# Patient Record
Sex: Female | Born: 1985 | Race: Black or African American | Hispanic: No | Marital: Single | Smoking: Never smoker
Health system: Southern US, Community
[De-identification: ages and names within clinical notes are randomized; demographics above are authoritative.]

## PROBLEM LIST (undated history)

## (undated) ENCOUNTER — Emergency Department (HOSPITAL_BASED_OUTPATIENT_CLINIC_OR_DEPARTMENT_OTHER): Admission: EM | Payer: BC Managed Care – PPO | Source: Home / Self Care

## (undated) DIAGNOSIS — E669 Obesity, unspecified: Secondary | ICD-10-CM

## (undated) DIAGNOSIS — I10 Essential (primary) hypertension: Secondary | ICD-10-CM

## (undated) DIAGNOSIS — E119 Type 2 diabetes mellitus without complications: Secondary | ICD-10-CM

## (undated) HISTORY — PX: CHOLECYSTECTOMY: SHX55

## (undated) HISTORY — PX: KNEE SURGERY: SHX244

## (undated) HISTORY — PX: TONSILLECTOMY: SUR1361

---

## 2013-09-30 ENCOUNTER — Emergency Department (HOSPITAL_COMMUNITY): Payer: Self-pay

## 2013-09-30 ENCOUNTER — Encounter (HOSPITAL_COMMUNITY): Payer: Self-pay | Admitting: Emergency Medicine

## 2013-09-30 ENCOUNTER — Emergency Department (HOSPITAL_COMMUNITY)
Admission: EM | Admit: 2013-09-30 | Discharge: 2013-10-01 | Disposition: A | Discharge status: Home / Self Care | Payer: Self-pay | Attending: Emergency Medicine | Admitting: Emergency Medicine

## 2013-09-30 DIAGNOSIS — N83202 Unspecified ovarian cyst, left side: Secondary | ICD-10-CM

## 2013-09-30 DIAGNOSIS — R000 Tachycardia, unspecified: Secondary | ICD-10-CM | POA: Insufficient documentation

## 2013-09-30 DIAGNOSIS — M47817 Spondylosis without myelopathy or radiculopathy, lumbosacral region: Secondary | ICD-10-CM | POA: Insufficient documentation

## 2013-09-30 DIAGNOSIS — R110 Nausea: Secondary | ICD-10-CM | POA: Insufficient documentation

## 2013-09-30 DIAGNOSIS — E669 Obesity, unspecified: Secondary | ICD-10-CM | POA: Insufficient documentation

## 2013-09-30 DIAGNOSIS — F17200 Nicotine dependence, unspecified, uncomplicated: Secondary | ICD-10-CM | POA: Insufficient documentation

## 2013-09-30 DIAGNOSIS — R51 Headache: Secondary | ICD-10-CM | POA: Insufficient documentation

## 2013-09-30 DIAGNOSIS — I10 Essential (primary) hypertension: Secondary | ICD-10-CM | POA: Insufficient documentation

## 2013-09-30 DIAGNOSIS — N83209 Unspecified ovarian cyst, unspecified side: Secondary | ICD-10-CM | POA: Insufficient documentation

## 2013-09-30 DIAGNOSIS — M549 Dorsalgia, unspecified: Secondary | ICD-10-CM | POA: Insufficient documentation

## 2013-09-30 DIAGNOSIS — Z3202 Encounter for pregnancy test, result negative: Secondary | ICD-10-CM | POA: Insufficient documentation

## 2013-09-30 HISTORY — DX: Obesity, unspecified: E66.9

## 2013-09-30 HISTORY — DX: Essential (primary) hypertension: I10

## 2013-09-30 LAB — I-STAT CHEM 8, ED
BUN: 8 mg/dL (ref 6–23)
CALCIUM ION: 1.22 mmol/L (ref 1.12–1.23)
Chloride: 106 mEq/L (ref 96–112)
Creatinine, Ser: 1.1 mg/dL (ref 0.50–1.10)
Glucose, Bld: 104 mg/dL — ABNORMAL HIGH (ref 70–99)
HEMATOCRIT: 47 % — AB (ref 36.0–46.0)
Hemoglobin: 16 g/dL — ABNORMAL HIGH (ref 12.0–15.0)
Potassium: 4 mEq/L (ref 3.7–5.3)
Sodium: 144 mEq/L (ref 137–147)
TCO2: 26 mmol/L (ref 0–100)

## 2013-09-30 LAB — CBC WITH DIFFERENTIAL/PLATELET
BASOS PCT: 1 % (ref 0–1)
Basophils Absolute: 0.1 10*3/uL (ref 0.0–0.1)
EOS PCT: 2 % (ref 0–5)
Eosinophils Absolute: 0.2 10*3/uL (ref 0.0–0.7)
HCT: 43.7 % (ref 36.0–46.0)
HEMOGLOBIN: 14.8 g/dL (ref 12.0–15.0)
Lymphocytes Relative: 33 % (ref 12–46)
Lymphs Abs: 4 10*3/uL (ref 0.7–4.0)
MCH: 29.2 pg (ref 26.0–34.0)
MCHC: 33.9 g/dL (ref 30.0–36.0)
MCV: 86.4 fL (ref 78.0–100.0)
MONO ABS: 0.8 10*3/uL (ref 0.1–1.0)
MONOS PCT: 7 % (ref 3–12)
Neutro Abs: 7.1 10*3/uL (ref 1.7–7.7)
Neutrophils Relative %: 57 % (ref 43–77)
Platelets: 283 10*3/uL (ref 150–400)
RBC: 5.06 MIL/uL (ref 3.87–5.11)
RDW: 13.6 % (ref 11.5–15.5)
WBC: 12.3 10*3/uL — ABNORMAL HIGH (ref 4.0–10.5)

## 2013-09-30 LAB — URINALYSIS, ROUTINE W REFLEX MICROSCOPIC
Bilirubin Urine: NEGATIVE
GLUCOSE, UA: NEGATIVE mg/dL
Ketones, ur: 15 mg/dL — AB
Leukocytes, UA: NEGATIVE
NITRITE: NEGATIVE
Protein, ur: >300 mg/dL — AB
Specific Gravity, Urine: 1.024 (ref 1.005–1.030)
Urobilinogen, UA: 0.2 mg/dL (ref 0.0–1.0)
pH: 5.5 (ref 5.0–8.0)

## 2013-09-30 LAB — URINE MICROSCOPIC-ADD ON

## 2013-09-30 LAB — POC URINE PREG, ED: Preg Test, Ur: NEGATIVE

## 2013-09-30 MED ORDER — ONDANSETRON 4 MG PO TBDP
8.0000 mg | ORAL_TABLET | Freq: Once | ORAL | Status: AC
  Administered 2013-09-30: 8 mg via ORAL
  Filled 2013-09-30: qty 2

## 2013-09-30 MED ORDER — SODIUM CHLORIDE 0.9 % IV BOLUS (SEPSIS)
1000.0000 mL | Freq: Once | INTRAVENOUS | Status: AC
Start: 2013-09-30 — End: 2013-09-30
  Administered 2013-09-30: 1000 mL via INTRAVENOUS

## 2013-09-30 MED ORDER — MORPHINE SULFATE 4 MG/ML IJ SOLN
4.0000 mg | Freq: Once | INTRAMUSCULAR | Status: AC
Start: 2013-09-30 — End: 2013-09-30
  Administered 2013-09-30: 4 mg via INTRAVENOUS
  Filled 2013-09-30: qty 1

## 2013-09-30 MED ORDER — TRAMADOL HCL 50 MG PO TABS: 50.0000 mg | ORAL_TABLET | Freq: Four times a day (QID) | ORAL | Status: DC | PRN

## 2013-09-30 MED ORDER — NAPROXEN 375 MG PO TABS: 375.0000 mg | ORAL_TABLET | Freq: Two times a day (BID) | ORAL | Status: DC

## 2013-09-30 NOTE — ED Notes (Addendum)
Pt reports onset today of headache, nausea, body pain and mid back pain. No acute distress noted at triage. Pt has been off bp meds x 2 months and also received first time depo injection today.

## 2013-09-30 NOTE — ED Provider Notes (Signed)
CSN: [id number]     Arrival date & time 09/30/13  1756 History   First MD Initiated Contact with Patient 09/30/13 2036     Chief Complaint  Patient presents with  . Back Pain  . Nausea     (Consider location/radiation/quality/duration/timing/severity/associated sxs/prior Treatment) HPI Comments: This normally healthy, 28 year old morbidly obese, female, who presents today with acute onset of headache, nausea, body pain, and back pain.  She states she went to work, but was unable to complete her shift.  She did not take anything for her symptoms.  She did receive a shot of Depakote today  The history is provided by the patient.    Past Medical History  Diagnosis Date  . Obesity   . Hypertension    History reviewed. No pertinent past surgical history. History reviewed. No pertinent family history. History  Substance Use Topics  . Smoking status: Not on file  . Smokeless tobacco: Not on file  . Alcohol Use: No   OB History   Grav Para Term Preterm Abortions TAB SAB Ect Mult Living                 Review of Systems  Respiratory: Negative for shortness of breath.   Cardiovascular: Negative for chest pain and leg swelling.  Gastrointestinal: Positive for nausea. Negative for vomiting.  Musculoskeletal: Positive for back pain and myalgias.  Skin: Negative for rash and wound.  Neurological: Positive for headaches.  All other systems reviewed and are negative.     Allergies  Review of patient's allergies indicates no known allergies.  Home Medications   Prior to Admission medications   Medication Sig Start Date End Date Taking? Authorizing Provider  naproxen (NAPROSYN) 375 MG tablet Take 1 tablet (375 mg total) by mouth 2 (two) times daily with a meal. 09/30/13   Arman Filter, NP  traMADol (ULTRAM) 50 MG tablet Take 1 tablet (50 mg total) by mouth every 6 (six) hours as needed. 09/30/13   Arman Filter, NP   BP 140/84  Pulse 87  Temp(Src) 98 F (36.7 C) (Oral)  Resp  20  SpO2 99%  LMP 09/10/2013 Physical Exam  Constitutional: She is oriented to person, place, and time. She appears well-developed and well-nourished.  Morbid obesity   HENT:  Head: Normocephalic and atraumatic.  Eyes: Pupils are equal, round, and reactive to light.  Neck: Normal range of motion.  Cardiovascular: Regular rhythm.  Tachycardia present.   Pulmonary/Chest: Effort normal and breath sounds normal. No respiratory distress.  Abdominal: Soft. She exhibits no distension.  exame limited by body habitus   Musculoskeletal: Normal range of motion.  Neurological: She is alert and oriented to person, place, and time.  Skin: Skin is warm. No rash noted.    ED Course  Procedures (including critical care time) Labs Review Labs Reviewed  URINALYSIS, ROUTINE W REFLEX MICROSCOPIC - Abnormal; Notable for the following:    APPearance CLOUDY (*)    Hgb urine dipstick LARGE (*)    Ketones, ur 15 (*)    Protein, ur >300 (*)    All other components within normal limits  URINE MICROSCOPIC-ADD ON - Abnormal; Notable for the following:    Squamous Epithelial / LPF MANY (*)    Bacteria, UA FEW (*)    Casts HYALINE CASTS (*)    All other components within normal limits  CBC WITH DIFFERENTIAL - Abnormal; Notable for the following:    WBC 12.3 (*)    All other components  within normal limits  I-STAT CHEM 8, ED - Abnormal; Notable for the following:    Glucose, Bld 104 (*)    Hemoglobin 16.0 (*)    HCT 47.0 (*)    All other components within normal limits  POC URINE PREG, ED    Imaging Review Ct Abdomen Pelvis Wo Contrast  09/30/2013   CLINICAL DATA:  Hematuria; pain and nausea  EXAM: CT ABDOMEN AND PELVIS WITHOUT CONTRAST  TECHNIQUE: Multidetector CT imaging of the abdomen and pelvis was performed following the standard protocol without oral or intravenous contrast material administration.  COMPARISON:  None.  FINDINGS: Lung bases are clear.  Liver is prominent measuring 18.3 cm in  length. No focal liver lesions are identified on this noncontrast enhanced study. Gallbladder is absent. There is no appreciable biliary duct dilatation.  Spleen, pancreas, and adrenals appear normal.  Kidneys bilaterally show no appreciable mass, hydronephrosis, or calculus on either side. No ureteral calculus or ureterectasis is appreciable on either side.  In the pelvis, urinary bladder is partially decompressed. There is no appreciable urinary bladder wall thickening. There is a cystic left ovarian mass measuring 5.9 x 3.9 cm. There is no other pelvic mass. There is no pelvic fluid. Appendix appears normal.  There is no appreciable bowel obstruction. No free air or portal venous air. There is no ascites, adenopathy, or abscess in the abdomen or pelvis. There is no abdominal aortic aneurysm. There are no blastic or lytic bone lesions. 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 Vacuum phenomenon is also noted in each sacroiliac joint.  IMPRESSION: Cystic left ovarian mass. This finding may warrant correlation with pelvic ultrasound to further assess.  No renal or ureteral calculus. No hydronephrosis. A cause for hematuria has not been established with this study.  Liver is enlarged. No focal liver lesions appreciable. Gallbladder absent.  No bowel obstruction. No abscess. Appendix appears normal. Note that there is osteoarthritic change in both hip and sacroiliac joint regions.   Electronically Signed   By: Bretta Bang M.D.   On: 09/30/2013 21:43   US Transvaginal Non-ob  09/30/2013   CLINICAL DATA:  Evaluate mass seen on recent CT examination in the left adnexa.  EXAM: TRANSABDOMINAL AND TRANSVAGINAL ULTRASOUND OF PELVIS  DOPPLER ULTRASOUND OF OVARIES  TECHNIQUE: Both transabdominal and transvaginal ultrasound examinations of the pelvis were performed. Transabdominal technique was performed for global imaging of the pelvis including  uterus, ovaries, adnexal regions, and pelvic cul-de-sac.  It was necessary to proceed with endovaginal exam following the transabdominal exam to visualize the ovaries. Color and duplex Doppler ultrasound was utilized to evaluate blood flow to the ovaries.  COMPARISON:  CT of the abdomen and pelvis 09/30/2013.  FINDINGS: Uterus  Measurements: 7.5 x 3.2 x 3.2 cm. No fibroids or other mass visualized.  Endometrium  Thickness: 4.3 mm thick.  No focal abnormality visualized.  Right ovary  Could not be visualized.  Left ovary  Measurements: 5.3 x 4.9 x 5.6 cm. Nearly the entire left ovary appears replaced by a complex partially cystic and solid minus with internal areas better anechoic and other areas that are isoechoic to hyperechoic. This lesion does not demonstrate definitive internal blood flow, this favored to represent a dermoid cyst.  Pulsed Doppler evaluation of the left ovary demonstrates normal low-resistance arterial and venous waveforms.  Other findings  No free fluid.  IMPRESSION: 1. Left ovarian mass has imaging characteristics that are most suggestive  of a dermoid cyst. Nonemergent surgical consultation may be warranted to consider removal, as this lesion although likely benign may place the patient at risk for torsion. No signs of left ovarian torsion at this time. Alternatively, a follow-up pelvic ultrasound could be obtained in 6-12 weeks to ensure short-term stability, with followed by yearly follow-up to demonstrate continued stability. 2. Limited study which was not able to visualize a right ovary.   Electronically Signed   By: Trudie Reed M.D.   On: 09/30/2013 23:22   US Pelvis Complete  09/30/2013   CLINICAL DATA:  Evaluate mass seen on recent CT examination in the left adnexa.  EXAM: TRANSABDOMINAL AND TRANSVAGINAL ULTRASOUND OF PELVIS  DOPPLER ULTRASOUND OF OVARIES  TECHNIQUE: Both transabdominal and transvaginal ultrasound examinations of the pelvis were performed. Transabdominal  technique was performed for global imaging of the pelvis including uterus, ovaries, adnexal regions, and pelvic cul-de-sac.  It was necessary to proceed with endovaginal exam following the transabdominal exam to visualize the ovaries. Color and duplex Doppler ultrasound was utilized to evaluate blood flow to the ovaries.  COMPARISON:  CT of the abdomen and pelvis 09/30/2013.  FINDINGS: Uterus  Measurements: 7.5 x 3.2 x 3.2 cm. No fibroids or other mass visualized.  Endometrium  Thickness: 4.3 mm thick.  No focal abnormality visualized.  Right ovary  Could not be visualized.  Left ovary  Measurements: 5.3 x 4.9 x 5.6 cm. Nearly the entire left ovary appears replaced by a complex partially cystic and solid minus with internal areas better anechoic and other areas that are isoechoic to hyperechoic. This lesion does not demonstrate definitive internal blood flow, this favored to represent a dermoid cyst.  Pulsed Doppler evaluation of the left ovary demonstrates normal low-resistance arterial and venous waveforms.  Other findings  No free fluid.  IMPRESSION: 1. Left ovarian mass has imaging characteristics that are most suggestive of a dermoid cyst. Nonemergent surgical consultation may be warranted to consider removal, as this lesion although likely benign may place the patient at risk for torsion. No signs of left ovarian torsion at this time. Alternatively, a follow-up pelvic ultrasound could be obtained in 6-12 weeks to ensure short-term stability, with followed by yearly follow-up to demonstrate continued stability. 2. Limited study which was not able to visualize a right ovary.   Electronically Signed   By: Trudie Reed M.D.   On: 09/30/2013 23:22   [place] Art/ven Flow Abd Pelv Doppler  09/30/2013   CLINICAL DATA:  Evaluate mass seen on recent CT examination in the left adnexa.  EXAM: TRANSABDOMINAL AND TRANSVAGINAL ULTRASOUND OF PELVIS  DOPPLER ULTRASOUND OF OVARIES  TECHNIQUE: Both transabdominal and  transvaginal ultrasound examinations of the pelvis were performed. Transabdominal technique was performed for global imaging of the pelvis including uterus, ovaries, adnexal regions, and pelvic cul-de-sac.  It was necessary to proceed with endovaginal exam following the transabdominal exam to visualize the ovaries. Color and duplex Doppler ultrasound was utilized to evaluate blood flow to the ovaries.  COMPARISON:  CT of the abdomen and pelvis 09/30/2013.  FINDINGS: Uterus  Measurements: 7.5 x 3.2 x 3.2 cm. No fibroids or other mass visualized.  Endometrium  Thickness: 4.3 mm thick.  No focal abnormality visualized.  Right ovary  Could not be visualized.  Left ovary  Measurements: 5.3 x 4.9 x 5.6 cm. Nearly the entire left ovary appears replaced by a complex partially cystic and solid minus with internal areas better anechoic and other areas that are isoechoic to hyperechoic.  This lesion does not demonstrate definitive internal blood flow, this favored to represent a dermoid cyst.  Pulsed Doppler evaluation of the left ovary demonstrates normal low-resistance arterial and venous waveforms.  Other findings  No free fluid.  IMPRESSION: 1. Left ovarian mass has imaging characteristics that are most suggestive of a dermoid cyst. Nonemergent surgical consultation may be warranted to consider removal, as this lesion although likely benign may place the patient at risk for torsion. No signs of left ovarian torsion at this time. Alternatively, a follow-up pelvic ultrasound could be obtained in 6-12 weeks to ensure short-term stability, with followed by yearly follow-up to demonstrate continued stability. 2. Limited study which was not able to visualize a right ovary.   Electronically Signed   By: Trudie Reedaniel  Entrikin M.D.   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 This has been discussed with the patient The ultrasound shows the patient has a dermoid cyst.  [facility].  She's been cautioned that he has a propensity to become torsed issues.  Return if she develops sharp pain in her left lower abdomen, otherwise, [facility]    Arman FilterGail K Aneliese Beaudry, NP 09/30/13 2341

## 2013-09-30 NOTE — ED Notes (Signed)
Back from us

## 2013-09-30 NOTE — ED Notes (Signed)
Patient transported to Ultrasound 

## 2013-09-30 NOTE — ED Notes (Signed)
Patient transported to CT 

## 2013-09-30 NOTE — Discharge Instructions (Signed)
Your CT scan showed that you have moderate osteoarthritis in your lower back, and spine.  This is most likely causing most of your discomfort, you also were found incidentally to have a small cyst on your left ovary.  This was further investigated with an ultrasound, which shows it is a dermoid cyst, and should be monitored by OB/GYN.  If he developed sudden, sharp pain in your left lower abdomen, please return to the emergency department for further evaluation, as this is has a chance to become twisted causing you to have increased sharp pain

## 2013-09-30 NOTE — ED Notes (Signed)
Report to Jenna RN

## 2013-10-01 NOTE — ED Provider Notes (Signed)
Medical screening examination/treatment/procedure(s) were performed by non-physician practitioner and as supervising physician I was immediately available for consultation/collaboration.   EKG Interpretation None        Candyce ChurnJohn David Clemmie Marxen III, MD 10/01/13 [id number]

## 2013-10-30 ENCOUNTER — Emergency Department (HOSPITAL_BASED_OUTPATIENT_CLINIC_OR_DEPARTMENT_OTHER)
Admission: EM | Admit: 2013-10-30 | Discharge: 2013-10-30 | Disposition: A | Discharge status: Home / Self Care | Payer: BC Managed Care – PPO | Attending: Emergency Medicine | Admitting: Emergency Medicine

## 2013-10-30 ENCOUNTER — Encounter (HOSPITAL_BASED_OUTPATIENT_CLINIC_OR_DEPARTMENT_OTHER): Payer: Self-pay | Admitting: Emergency Medicine

## 2013-10-30 DIAGNOSIS — E669 Obesity, unspecified: Secondary | ICD-10-CM | POA: Diagnosis not present

## 2013-10-30 DIAGNOSIS — M549 Dorsalgia, unspecified: Secondary | ICD-10-CM | POA: Insufficient documentation

## 2013-10-30 DIAGNOSIS — M25569 Pain in unspecified knee: Secondary | ICD-10-CM | POA: Insufficient documentation

## 2013-10-30 DIAGNOSIS — Z79899 Other long term (current) drug therapy: Secondary | ICD-10-CM | POA: Insufficient documentation

## 2013-10-30 DIAGNOSIS — M546 Pain in thoracic spine: Secondary | ICD-10-CM | POA: Insufficient documentation

## 2013-10-30 DIAGNOSIS — E119 Type 2 diabetes mellitus without complications: Secondary | ICD-10-CM | POA: Insufficient documentation

## 2013-10-30 DIAGNOSIS — I10 Essential (primary) hypertension: Secondary | ICD-10-CM | POA: Diagnosis not present

## 2013-10-30 HISTORY — DX: Type 2 diabetes mellitus without complications: E11.9

## 2013-10-30 MED ORDER — IBUPROFEN 800 MG PO TABS: 800.0000 mg | ORAL_TABLET | Freq: Three times a day (TID) | ORAL | Status: DC

## 2013-10-30 MED ORDER — CYCLOBENZAPRINE HCL 10 MG PO TABS
10.0000 mg | ORAL_TABLET | Freq: Two times a day (BID) | ORAL | Status: DC | PRN
Start: 2013-10-30 — End: 2014-04-15

## 2013-10-30 MED ORDER — IBUPROFEN 800 MG PO TABS
800.0000 mg | ORAL_TABLET | Freq: Once | ORAL | Status: AC
  Administered 2013-10-30: 800 mg via ORAL
  Filled 2013-10-30: qty 1

## 2013-10-30 NOTE — Discharge Instructions (Signed)

## 2013-10-30 NOTE — ED Provider Notes (Signed)
CSN: [id number]     Arrival date & time 10/30/13  1828 History   First MD Initiated Contact with Patient 10/30/13 2001     This chart was scribed for Elwin Mocha, MD by Arlan Organ, ED Scribe. This patient was seen in room MH11/MH11 and the patient's care was started 8:08 PM.   Chief Complaint  Patient presents with  . Back Pain  . Knee Pain   Patient is a 28 y.o. female presenting with back pain. The history is provided by the patient. No language interpreter was used.  Back Pain Location:  Generalized Radiates to:  Does not radiate Pain severity:  Moderate Pain is:  Worse during the day Onset quality:  Gradual Duration:  1 week Timing:  Constant Progression:  Worsening Chronicity:  New Context: not falling, not lifting heavy objects and not recent injury   Relieved by:  Nothing Worsened by:  Sitting Ineffective treatments:  NSAIDs Associated symptoms: no abdominal pain, no chest pain and no fever     HPI Comments: Jessica Terrell is a 28 y.o. female who presents to the Emergency Department complaining of constant, moderate back pain x 1.5 weeks. Pt states pain has gradually worsened since time of onset. Ms. Keiper also reports bilaterally knee pain; R worse than L. No known trauma or injury. Pain is exacerbated with sitting for long periods of time and alleviating when standing. She has tried OTC Naproxen with mild temporary improvement for symptoms. She denies any fever, chills, nausea, vomiting, abdominal pain, CP, or SOB. Pt currently works at a call center as a Museum/gallery conservator. She denies getting up for short breaks throughout her shifts. No known allergies to medications.  Past Medical History  Diagnosis Date  . Obesity   . Hypertension   . Diabetes mellitus without complication    Past Surgical History  Procedure Laterality Date  . Cholecystectomy    . Tonsillectomy    . Knee surgery     No family history on file. History  Substance Use Topics  . Smoking  status: Never Smoker   . Smokeless tobacco: Not on file  . Alcohol Use: No   OB History   Grav Para Term Preterm Abortions TAB SAB Ect Mult Living                 Review of Systems  Constitutional: Negative for fever and chills.  Respiratory: Negative for shortness of breath.   Cardiovascular: Negative for chest pain.  Gastrointestinal: Negative for nausea, vomiting and abdominal pain.  Musculoskeletal: Positive for back pain.  All other systems reviewed and are negative.     Allergies  Review of patient's allergies indicates no known allergies.  Home Medications   Prior to Admission medications   Medication Sig Start Date End Date Taking? Authorizing Provider  naproxen (NAPROSYN) 375 MG tablet Take 1 tablet (375 mg total) by mouth 2 (two) times daily with a meal. 09/30/13   Arman Filter, NP  traMADol (ULTRAM) 50 MG tablet Take 1 tablet (50 mg total) by mouth every 6 (six) hours as needed. 09/30/13   Arman Filter, NP   Triage Vitals: BP 167/98  Pulse 106  Temp(Src) 98.5 F (36.9 C) (Oral)  Resp 20  Ht  (1.626 m)  Wt 350 lb (158.759 kg)  BMI 60.05 kg/m2  SpO2 100%  LMP 10/23/2013   Physical Exam  Nursing note and vitals reviewed. Constitutional: She is oriented to person, place, and time. She appears well-developed  and well-nourished. No distress.  HENT:  Head: Normocephalic and atraumatic.  Mouth/Throat: Oropharynx is clear and moist.  Eyes: EOM are normal. Pupils are equal, round, and reactive to light.  Neck: Normal range of motion. Neck supple.  Cardiovascular: Normal rate and regular rhythm.  Exam reveals no friction rub.   No murmur heard. Pulmonary/Chest: Effort normal and breath sounds normal. No respiratory distress. She has no wheezes. She has no rales.  Abdominal: Soft. She exhibits no distension. There is no tenderness. There is no rebound.  Musculoskeletal: Normal range of motion. She exhibits no edema.       Cervical back: She exhibits  tenderness. She exhibits normal range of motion, no bony tenderness, no swelling and no edema.       Thoracic back: She exhibits tenderness. She exhibits normal range of motion, no bony tenderness, no swelling, no edema and no deformity.  Diffuse upper back muscular tenderness  Neurological: She is alert and oriented to person, place, and time. No cranial nerve deficit. She exhibits normal muscle tone. Coordination normal.  Skin: Skin is warm. No rash noted. She is not diaphoretic.    ED Course  Procedures (including critical care time)  DIAGNOSTIC STUDIES: Oxygen Saturation is 100% on RA, Normal by my interpretation.    COORDINATION OF CARE: 8:31 PM-Discussed treatment plan with pt at bedside and pt agreed to plan.     Labs Review Labs Reviewed - No data to display  Imaging Review No results found.   EKG Interpretation None      MDM   Final diagnoses:  Midline thoracic back pain    28 show female here with back pain. Started and one half weeks ago. At the computer all day. No injuries. No fevers. Tolerating by mouth. No vomiting or diarrhea. Vitals stable here.Marland KitchenExhibits diffuse upper back tenderness in the musculature. He states it's all mostly upper back. I suspect this is due to her morbid obesity and poor posture or. Instructed to take breaks at work and stand up to state it is better when she sits up straight so I recommended lumbar support at work. Stable for discharge.  I personally performed the services described in this documentation, which was scribed in my presence. The recorded information has been reviewed and is accurate.    Elwin Mocha, MD 10/30/13 [phone]

## 2013-10-30 NOTE — ED Notes (Signed)
Back pain and pain in both knees but worse on the right. Pain started 1.5 weeks ago. No known injury. States it is hard for her to focus at work due to pain.

## 2013-11-05 ENCOUNTER — Encounter: Payer: Self-pay | Admitting: Obstetrics & Gynecology

## 2013-11-14 ENCOUNTER — Encounter (HOSPITAL_COMMUNITY): Payer: Self-pay | Admitting: Emergency Medicine

## 2013-11-14 ENCOUNTER — Emergency Department (INDEPENDENT_AMBULATORY_CARE_PROVIDER_SITE_OTHER)
Admission: EM | Admit: 2013-11-14 | Discharge: 2013-11-14 | Disposition: A | Discharge status: Home / Self Care | Payer: BC Managed Care – PPO | Source: Home / Self Care | Attending: Family Medicine | Admitting: Family Medicine

## 2013-11-14 DIAGNOSIS — G44209 Tension-type headache, unspecified, not intractable: Secondary | ICD-10-CM

## 2013-11-14 DIAGNOSIS — R110 Nausea: Secondary | ICD-10-CM

## 2013-11-14 DIAGNOSIS — B349 Viral infection, unspecified: Secondary | ICD-10-CM

## 2013-11-14 DIAGNOSIS — B9789 Other viral agents as the cause of diseases classified elsewhere: Secondary | ICD-10-CM

## 2013-11-14 LAB — POCT I-STAT, CHEM 8
BUN: 4 mg/dL — ABNORMAL LOW (ref 6–23)
CREATININE: 1 mg/dL (ref 0.50–1.10)
Calcium, Ion: 1.21 mmol/L (ref 1.12–1.23)
Chloride: 107 mEq/L (ref 96–112)
Glucose, Bld: 119 mg/dL — ABNORMAL HIGH (ref 70–99)
HCT: 51 % — ABNORMAL HIGH (ref 36.0–46.0)
HEMOGLOBIN: 17.3 g/dL — AB (ref 12.0–15.0)
POTASSIUM: 4 meq/L (ref 3.7–5.3)
SODIUM: 142 meq/L (ref 137–147)
TCO2: 25 mmol/L (ref 0–100)

## 2013-11-14 LAB — POCT URINALYSIS DIP (DEVICE)
Bilirubin Urine: NEGATIVE
Glucose, UA: NEGATIVE mg/dL
Hgb urine dipstick: NEGATIVE
Ketones, ur: NEGATIVE mg/dL
Leukocytes, UA: NEGATIVE
Nitrite: NEGATIVE
Protein, ur: >=300 mg/dL — AB
Specific Gravity, Urine: 1.025 (ref 1.005–1.030)
UROBILINOGEN UA: 0.2 mg/dL (ref 0.0–1.0)
pH: 6 (ref 5.0–8.0)

## 2013-11-14 LAB — POCT PREGNANCY, URINE: PREG TEST UR: NEGATIVE

## 2013-11-14 MED ORDER — ONDANSETRON 8 MG PO TBDP: 8.0000 mg | ORAL_TABLET | Freq: Three times a day (TID) | ORAL | Status: DC | PRN

## 2013-11-14 MED ORDER — FLUTICASONE PROPIONATE 50 MCG/ACT NA SUSP: 2.0000 | Freq: Two times a day (BID) | NASAL | Status: DC

## 2013-11-14 MED ORDER — CETIRIZINE HCL 10 MG PO TABS: 10.0000 mg | ORAL_TABLET | Freq: Every day | ORAL | Status: DC

## 2013-11-14 NOTE — ED Notes (Signed)
C/o HA, BA and feeling nauseas onset Wednesday Denies cold sx, f/v/d Alert, no signs of acute distress.

## 2013-11-14 NOTE — Discharge Instructions (Signed)

## 2013-11-14 NOTE — ED Provider Notes (Signed)
CSN: [id number]     Arrival date & time 11/14/13  1246 History   First MD Initiated Contact with Patient 11/14/13 1331     Chief Complaint  Patient presents with  . Generalized Body Aches   (Consider location/radiation/quality/duration/timing/severity/associated sxs/prior Treatment) HPI Comments: 28 year old morbidly obese female with history of hypertension and diabetes presents complaining of frontal headache, mild diffuse body aches, nausea. Her symptoms began 2 days ago. She has been taking over-the-counter medications with minimal relief. She denies fever, chills, vomiting, diarrhea, abdominal pain, cough, shortness of breath. 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   Past Medical History  Diagnosis Date  . Obesity   . Hypertension   . Diabetes mellitus without complication    Past Surgical History  Procedure Laterality Date  . Cholecystectomy    . Tonsillectomy    . Knee surgery     No family history on file. History  Substance Use Topics  . Smoking status: Never Smoker   . Smokeless tobacco: Not on file  . Alcohol Use: No   OB History   Grav Para Term Preterm Abortions TAB SAB Ect Mult Living                 Review of Systems  Constitutional: Negative for fever and chills.  HENT: Positive for sinus pressure. Negative for congestion, ear pain and rhinorrhea.   Respiratory: Negative for cough, chest tightness, shortness of breath and wheezing.   Cardiovascular: Negative for chest pain.  Gastrointestinal: Positive for nausea. Negative for vomiting, abdominal pain and diarrhea.  Neurological: Positive for headaches. Negative for dizziness and weakness.  All other systems reviewed and are negative.   Allergies  Review of patient's allergies indicates no known allergies.  Home Medications   Prior to Admission medications   Medication Sig Start Date End Date Taking? Authorizing Provider   cyclobenzaprine (FLEXERIL) 10 MG tablet Take 1 tablet (10 mg total) by mouth 2 (two) times daily as needed for muscle spasms. 10/30/13  Yes Elwin Mocha, MD  ibuprofen (ADVIL,MOTRIN) 800 MG tablet Take 1 tablet (800 mg total) by mouth 3 (three) times daily. 10/30/13  Yes Elwin Mocha, MD  cetirizine (ZYRTEC) 10 MG tablet Take 1 tablet (10 mg total) by mouth daily. 11/14/13   Adrian Blackwater Thomasena Vandenheuvel, PA-C  fluticasone (FLONASE) 50 MCG/ACT nasal spray Place 2 sprays into both nostrils 2 (two) times daily. Decrease to 2 sprays/nostril daily after 5 days 11/14/13   Graylon Good, PA-C  naproxen (NAPROSYN) 375 MG tablet Take 1 tablet (375 mg total) by mouth 2 (two) times daily with a meal. 09/30/13   Arman Filter, NP  ondansetron (ZOFRAN ODT) 8 MG disintegrating tablet Take 1 tablet (8 mg total) by mouth every 8 (eight) hours as needed for nausea or vomiting. 11/14/13   Graylon Good, PA-C  traMADol (ULTRAM) 50 MG tablet Take 1 tablet (50 mg total) by mouth every 6 (six) hours as needed. 09/30/13   Arman Filter, NP   BP 145/93  Pulse 88  Temp(Src) 97.9 F (36.6 C) (Oral)  Resp 16  SpO2 96%  LMP 10/23/2013 Physical Exam  Nursing note and vitals reviewed. Constitutional: She is oriented to person, place, and time. Vital signs are normal. She appears well-developed and well-nourished. No distress.  Morbidly obese habitus  HENT:  Head: Normocephalic and atraumatic.  Right Ear: Tympanic membrane, external ear and ear canal normal.  Left Ear: Tympanic membrane, external ear and ear canal normal.  Nose: Nose normal. Right sinus exhibits no maxillary sinus tenderness and no frontal sinus tenderness. Left sinus exhibits no maxillary sinus tenderness and no frontal sinus tenderness.  Mouth/Throat: Uvula is midline, oropharynx is clear and moist and mucous membranes are normal.  Eyes: Conjunctivae are normal. Right eye exhibits no discharge. Left eye exhibits no discharge.  Neck: Normal range of motion. Neck  supple.  Cardiovascular: Normal rate, regular rhythm and normal heart sounds.   Pulmonary/Chest: Effort normal and breath sounds normal. No respiratory distress.  Lymphadenopathy:    She has no cervical adenopathy.  Neurological: She is alert and oriented to person, place, and time. She has normal strength. Coordination normal.  Skin: Skin is warm and dry. No rash noted. She is not diaphoretic.  Psychiatric: She has a normal mood and affect. Judgment normal.    ED Course  Procedures (including critical care time) Labs Review Labs Reviewed  POCT URINALYSIS DIP (DEVICE) - Abnormal; Notable for the following:    Protein, ur >=300 (*)    All other components within normal limits  POCT I-STAT, CHEM 8 - Abnormal; Notable for the following:    BUN 4 (*)    Glucose, Bld 119 (*)    Hemoglobin 17.3 (*)    HCT 51.0 (*)    All other components within normal limits  URINE CULTURE  POCT PREGNANCY, URINE    Imaging Review No results found.   MDM   1. Viral infection   2. Tension headache   3. Nausea    Physical exam is normal. Most likely viral infection. Followup with primary care to recheck the urine in a week. ED if worsening.   Meds ordered this encounter  Medications  . fluticasone (FLONASE) 50 MCG/ACT nasal spray    Sig: Place 2 sprays into both nostrils 2 (two) times daily. Decrease to 2 sprays/nostril daily after 5 days    Dispense:  16 g    Refill:  2    Order Specific Question:  Supervising Provider    Answer:  Linna Hoff [phone]  . cetirizine (ZYRTEC) 10 MG tablet    Sig: Take 1 tablet (10 mg total) by mouth daily.    Dispense:  30 tablet    Refill:  0    Order Specific Question:  Supervising Provider    Answer:  Linna Hoff [phone]  . ondansetron (ZOFRAN ODT) 8 MG disintegrating tablet    Sig: Take 1 tablet (8 mg total) by mouth every 8 (eight) hours as needed for nausea or vomiting.    Dispense:  12 tablet    Refill:  0    Order Specific Question:   Supervising Provider    Answer:  Bradd Canary D [[id number]]       Graylon Good, PA-C 11/14/13 [phone]

## 2013-11-15 LAB — URINE CULTURE: Colony Count: 50000

## 2013-11-15 NOTE — ED Provider Notes (Signed)
Medical screening examination/treatment/procedure(s) were performed by resident physician or non-physician practitioner and as supervising physician I was immediately available for consultation/collaboration.   Keno Caraway DOUGLAS MD.   Cherylin Waguespack D Amilliana Hayworth, MD 11/15/13 0954 

## 2013-12-15 ENCOUNTER — Encounter (HOSPITAL_COMMUNITY): Payer: Self-pay | Admitting: Emergency Medicine

## 2013-12-15 ENCOUNTER — Emergency Department (INDEPENDENT_AMBULATORY_CARE_PROVIDER_SITE_OTHER)
Admission: EM | Admit: 2013-12-15 | Discharge: 2013-12-15 | Disposition: A | Discharge status: Home / Self Care | Payer: BC Managed Care – PPO | Source: Home / Self Care | Attending: Family Medicine | Admitting: Family Medicine

## 2013-12-15 DIAGNOSIS — J020 Streptococcal pharyngitis: Secondary | ICD-10-CM

## 2013-12-15 LAB — POCT RAPID STREP A: STREPTOCOCCUS, GROUP A SCREEN (DIRECT): NEGATIVE

## 2013-12-15 MED ORDER — CLINDAMYCIN HCL 300 MG PO CAPS: 300.0000 mg | ORAL_CAPSULE | Freq: Three times a day (TID) | ORAL | Status: DC

## 2013-12-15 NOTE — Discharge Instructions (Signed)
Drink lots of fluids, take all of medicine, use lozenges as needed.return if needed °

## 2013-12-15 NOTE — ED Provider Notes (Signed)
CSN: [id number]     Arrival date & time 12/15/13  1334 History   First MD Initiated Contact with Patient 12/15/13 1443     Chief Complaint  Patient presents with  . Sore Throat   (Consider location/radiation/quality/duration/timing/severity/associated sxs/prior Treatment) Patient is a 28 y.o. female presenting with pharyngitis. The history is provided by the patient.  Sore Throat This is a new problem. The current episode started 2 days ago. The problem has been gradually worsening. Pertinent negatives include no chest pain, no abdominal pain and no headaches. The symptoms are aggravated by swallowing.    Past Medical History  Diagnosis Date  . Obesity   . Hypertension   . Diabetes mellitus without complication    Past Surgical History  Procedure Laterality Date  . Cholecystectomy    . Tonsillectomy    . Knee surgery     History reviewed. No pertinent family history. History  Substance Use Topics  . Smoking status: Never Smoker   . Smokeless tobacco: Not on file  . Alcohol Use: No   OB History   Grav Para Term Preterm Abortions TAB SAB Ect Mult Living                 Review of Systems  Constitutional: Positive for fever and chills.  HENT: Positive for congestion, postnasal drip and rhinorrhea.   Respiratory: Negative for cough.   Cardiovascular: Negative.  Negative for chest pain.  Gastrointestinal: Negative.  Negative for abdominal pain.  Skin: Negative.   Neurological: Negative for headaches.    Allergies  Review of patient's allergies indicates no known allergies.  Home Medications   Prior to Admission medications   Medication Sig Start Date End Date Taking? Authorizing Provider  cetirizine (ZYRTEC) 10 MG tablet Take 1 tablet (10 mg total) by mouth daily. 11/14/13   Graylon GoodZachary H Baker, PA-C  clindamycin (CLEOCIN) 300 MG capsule Take 1 capsule (300 mg total) by mouth 3 (three) times daily. 12/15/13   Linna HoffJames D Miaa Latterell, MD  cyclobenzaprine (FLEXERIL) 10 MG tablet  Take 1 tablet (10 mg total) by mouth 2 (two) times daily as needed for muscle spasms. 10/30/13   Elwin MochaBlair Walden, MD  fluticasone [facility](FLONASE) 50 MCG/ACT nasal spray Place 2 sprays into both nostrils 2 (two) times daily. Decrease to 2 sprays/nostril daily after 5 days 11/14/13   Graylon GoodZachary H Baker, PA-C  ibuprofen (ADVIL,MOTRIN) 800 MG tablet Take 1 tablet (800 mg total) by mouth 3 (three) times daily. 10/30/13   Elwin MochaBlair Walden, MD  naproxen (NAPROSYN) 375 MG tablet Take 1 tablet (375 mg total) by mouth 2 (two) times daily with a meal. 09/30/13   Arman FilterGail K Schulz, NP  ondansetron (ZOFRAN ODT) 8 MG disintegrating tablet Take 1 tablet (8 mg total) by mouth every 8 (eight) hours as needed for nausea or vomiting. 11/14/13   Graylon GoodZachary H Baker, PA-C  traMADol (ULTRAM) 50 MG tablet Take 1 tablet (50 mg total) by mouth every 6 (six) hours as needed. 09/30/13   Arman FilterGail K Schulz, NP   BP 150/97  Pulse 90  Temp(Src) 98.9 F (37.2 C) (Oral)  Resp 18  SpO2 97% Physical Exam  Nursing note and vitals reviewed. Constitutional: She is oriented to person, place, and time. She appears well-developed and well-nourished.  HENT:  Right Ear: External ear normal.  Left Ear: External ear normal.  Mouth/Throat: Oropharynx is clear and moist.  Eyes: Conjunctivae are normal. Pupils are equal, round, and reactive to light.  Neck: Normal range of motion. Neck  supple.  Cardiovascular: Normal heart sounds.   Pulmonary/Chest: Effort normal and breath sounds normal.  Lymphadenopathy:    She has cervical adenopathy.  Neurological: She is alert and oriented to person, place, and time.  Skin: Skin is warm and dry.    ED Course  Procedures (including critical care time) Labs Review Labs Reviewed  POCT RAPID STREP A ([facility] ONLY)    Imaging Review No results found.   MDM   1. Streptococcal sore throat        Linna HoffJames D Malissa Slay, MD 12/15/13 [phone]

## 2013-12-15 NOTE — ED Notes (Signed)
Pt  Reports     Symptoms  Of   sorethroat      With  Pain  When  She  Swallows        She  Also  Reports  Chills      And  Fever  As  Well   Pt  Reports  The  Symptoms  X  2  Days             Symptoms    Not  releived by    Tylenol

## 2013-12-16 LAB — CULTURE, GROUP A STREP

## 2013-12-18 ENCOUNTER — Telehealth (HOSPITAL_COMMUNITY): Payer: Self-pay | Admitting: *Deleted

## 2013-12-18 NOTE — ED Notes (Signed)
Throat culture: Strep beta hemolytic not group A.  Pt. adequately treated with Cleocin.  I called pt. Call 1. [name], Jessica Terrell M 12/18/2013

## 2013-12-24 NOTE — ED Notes (Signed)
Unable to reach pt. by phone x 3.  Confidential marked letter sent with lab result and instructions. Jessica Terrell, [name] M 12/24/2013

## 2014-02-05 ENCOUNTER — Emergency Department (HOSPITAL_COMMUNITY): Payer: BC Managed Care – PPO

## 2014-02-05 ENCOUNTER — Encounter (HOSPITAL_COMMUNITY): Payer: Self-pay | Admitting: Emergency Medicine

## 2014-02-05 ENCOUNTER — Emergency Department (HOSPITAL_COMMUNITY)
Admission: EM | Admit: 2014-02-05 | Discharge: 2014-02-06 | Disposition: A | Discharge status: Home / Self Care | Payer: Self-pay | Attending: Emergency Medicine | Admitting: Emergency Medicine

## 2014-02-05 DIAGNOSIS — R10814 Left lower quadrant abdominal tenderness: Secondary | ICD-10-CM

## 2014-02-05 DIAGNOSIS — Z3202 Encounter for pregnancy test, result negative: Secondary | ICD-10-CM | POA: Insufficient documentation

## 2014-02-05 DIAGNOSIS — E119 Type 2 diabetes mellitus without complications: Secondary | ICD-10-CM | POA: Insufficient documentation

## 2014-02-05 DIAGNOSIS — R809 Proteinuria, unspecified: Secondary | ICD-10-CM | POA: Insufficient documentation

## 2014-02-05 DIAGNOSIS — B9689 Other specified bacterial agents as the cause of diseases classified elsewhere: Secondary | ICD-10-CM

## 2014-02-05 DIAGNOSIS — E669 Obesity, unspecified: Secondary | ICD-10-CM | POA: Insufficient documentation

## 2014-02-05 DIAGNOSIS — R110 Nausea: Secondary | ICD-10-CM

## 2014-02-05 DIAGNOSIS — I10 Essential (primary) hypertension: Secondary | ICD-10-CM | POA: Insufficient documentation

## 2014-02-05 DIAGNOSIS — N760 Acute vaginitis: Secondary | ICD-10-CM | POA: Insufficient documentation

## 2014-02-05 LAB — CBC WITH DIFFERENTIAL/PLATELET
Basophils Absolute: 0 10*3/uL (ref 0.0–0.1)
Basophils Relative: 0 % (ref 0–1)
EOS ABS: 0.3 10*3/uL (ref 0.0–0.7)
EOS PCT: 2 % (ref 0–5)
HEMATOCRIT: 42.6 % (ref 36.0–46.0)
HEMOGLOBIN: 14.7 g/dL (ref 12.0–15.0)
LYMPHS ABS: 3.9 10*3/uL (ref 0.7–4.0)
Lymphocytes Relative: 36 % (ref 12–46)
MCH: 29 pg (ref 26.0–34.0)
MCHC: 34.5 g/dL (ref 30.0–36.0)
MCV: 84 fL (ref 78.0–100.0)
MONO ABS: 0.6 10*3/uL (ref 0.1–1.0)
MONOS PCT: 6 % (ref 3–12)
Neutro Abs: 6.1 10*3/uL (ref 1.7–7.7)
Neutrophils Relative %: 56 % (ref 43–77)
PLATELETS: 310 10*3/uL (ref 150–400)
RBC: 5.07 MIL/uL (ref 3.87–5.11)
RDW: 13.5 % (ref 11.5–15.5)
WBC: 10.8 10*3/uL — ABNORMAL HIGH (ref 4.0–10.5)

## 2014-02-05 LAB — URINALYSIS, ROUTINE W REFLEX MICROSCOPIC
Bilirubin Urine: NEGATIVE
Glucose, UA: NEGATIVE mg/dL
Hgb urine dipstick: NEGATIVE
KETONES UR: 15 mg/dL — AB
LEUKOCYTES UA: NEGATIVE
Nitrite: NEGATIVE
Specific Gravity, Urine: 1.024 (ref 1.005–1.030)
UROBILINOGEN UA: 1 mg/dL (ref 0.0–1.0)
pH: 5.5 (ref 5.0–8.0)

## 2014-02-05 LAB — COMPREHENSIVE METABOLIC PANEL
ALT: 16 U/L (ref 0–35)
ANION GAP: 16 — AB (ref 5–15)
AST: 15 U/L (ref 0–37)
Albumin: 3.9 g/dL (ref 3.5–5.2)
Alkaline Phosphatase: 71 U/L (ref 39–117)
BUN: 9 mg/dL (ref 6–23)
CALCIUM: 9.5 mg/dL (ref 8.4–10.5)
CO2: 21 mEq/L (ref 19–32)
CREATININE: 0.86 mg/dL (ref 0.50–1.10)
Chloride: 103 mEq/L (ref 96–112)
GLUCOSE: 105 mg/dL — AB (ref 70–99)
Potassium: 3.9 mEq/L (ref 3.7–5.3)
Sodium: 140 mEq/L (ref 137–147)
TOTAL PROTEIN: 7.9 g/dL (ref 6.0–8.3)
Total Bilirubin: 0.5 mg/dL (ref 0.3–1.2)

## 2014-02-05 LAB — URINE MICROSCOPIC-ADD ON

## 2014-02-05 LAB — PREGNANCY, URINE: Preg Test, Ur: NEGATIVE

## 2014-02-05 LAB — WET PREP, GENITAL
Trich, Wet Prep: NONE SEEN
YEAST WET PREP: NONE SEEN

## 2014-02-05 MED ORDER — ONDANSETRON HCL 4 MG PO TABS: 4.0000 mg | ORAL_TABLET | Freq: Four times a day (QID) | ORAL | Status: DC

## 2014-02-05 MED ORDER — KETOROLAC TROMETHAMINE 60 MG/2ML IM SOLN
60.0000 mg | Freq: Once | INTRAMUSCULAR | Status: AC
  Administered 2014-02-05: 60 mg via INTRAMUSCULAR
  Filled 2014-02-05: qty 2

## 2014-02-05 MED ORDER — METRONIDAZOLE 500 MG PO TABS: 500.0000 mg | ORAL_TABLET | Freq: Two times a day (BID) | ORAL | Status: DC

## 2014-02-05 MED ORDER — ONDANSETRON HCL 4 MG PO TABS
4.0000 mg | ORAL_TABLET | Freq: Once | ORAL | Status: AC
  Administered 2014-02-05: 4 mg via ORAL
  Filled 2014-02-05: qty 1

## 2014-02-05 NOTE — ED Provider Notes (Signed)
CSN: [id number]     Arrival date & time 02/05/14  1846 History   First MD Initiated Contact with Patient 02/05/14 2019     Chief Complaint  Patient presents with  . Abdominal Pain     (Consider location/radiation/quality/duration/timing/severity/associated sxs/prior Treatment) HPI  Jessica Terrell is a 28 y.o. female with PMH of DM, hypertension, obesity presenting with left lower quadrant abdominal tenderness which began 2 days ago. Pain is described as a sharp sensation that is intermittent. Not worse with movement or eating. Patient has taken Tylenol without relief. Patient with nausea but no vomiting. Last bowel movement yesterday and normal nonbloody. Patient denies any pelvic complaints. Last test. In August the patient is on Depakote. She denies spotting. No urinary complaints. No fevers or chills. Patient states she has decreased appetite but still eating. Patient states she has had similar pain in the past and was evaluated here in August with pelvic ultrasound and CT. Patient was found to have a dermoid cyst on her left ovary. Ultrasound recommended repeat ultrasound in 6-12 weeks. CT without other abdominal or pelvic abnormalities.   Past Medical History  Diagnosis Date  . Obesity   . Hypertension   . Diabetes mellitus without complication    Past Surgical History  Procedure Laterality Date  . Cholecystectomy    . Tonsillectomy    . Knee surgery     No family history on file. History  Substance Use Topics  . Smoking status: Never Smoker   . Smokeless tobacco: Not on file  . Alcohol Use: No   OB History    No data available     Review of Systems  Constitutional: Negative for fever and chills.  Eyes: Negative for visual disturbance.  Respiratory: Negative for cough and shortness of breath.   Cardiovascular: Negative for chest pain and palpitations.  Gastrointestinal: Positive for nausea and abdominal pain. Negative for vomiting and diarrhea.  Genitourinary:  Negative for dysuria and hematuria.  Musculoskeletal: Negative for back pain and gait problem.  Skin: Negative for rash.  Neurological: Negative for weakness and light-headedness.      Allergies  Review of patient's allergies indicates no known allergies.  Home Medications   Prior to Admission medications   Medication Sig Start Date End Date Taking? Authorizing Provider  acetaminophen (TYLENOL) 325 MG tablet Take 650 mg by mouth every 6 (six) hours as needed for mild pain.   Yes Historical Provider, MD  cetirizine (ZYRTEC) 10 MG tablet Take 1 tablet (10 mg total) by mouth daily. Patient not taking: Reported on 02/05/2014 11/14/13   Graylon Good, PA-C  clindamycin (CLEOCIN) 300 MG capsule Take 1 capsule (300 mg total) by mouth 3 (three) times daily. Patient not taking: Reported on 02/05/2014 12/15/13   Linna Hoff, MD  cyclobenzaprine (FLEXERIL) 10 MG tablet Take 1 tablet (10 mg total) by mouth 2 (two) times daily as needed for muscle spasms. Patient not taking: Reported on 02/05/2014 10/30/13   Elwin Mocha, MD  fluticasone [facility]) 50 MCG/ACT nasal spray Place 2 sprays into both nostrils 2 (two) times daily. Decrease to 2 sprays/nostril daily after 5 days Patient not taking: Reported on 02/05/2014 11/14/13   Graylon Good, PA-C  ibuprofen (ADVIL,MOTRIN) 800 MG tablet Take 1 tablet (800 mg total) by mouth 3 (three) times daily. Patient not taking: Reported on 02/05/2014 10/30/13   Elwin Mocha, MD  metroNIDAZOLE (FLAGYL) 500 MG tablet Take 1 tablet (500 mg total) by mouth 2 (two) times daily. 02/05/14  Louann SjogrenVictoria L Mishelle Hassan, PA-C  naproxen (NAPROSYN) 375 MG tablet Take 1 tablet (375 mg total) by mouth 2 (two) times daily with a meal. Patient not taking: Reported on 02/05/2014 09/30/13   Arman FilterGail K Schulz, NP  ondansetron (ZOFRAN ODT) 8 MG disintegrating tablet Take 1 tablet (8 mg total) by mouth every 8 (eight) hours as needed for nausea or vomiting. Patient not taking: Reported on  02/05/2014 11/14/13   Graylon GoodZachary H Baker, PA-C  ondansetron (ZOFRAN) 4 MG tablet Take 1 tablet (4 mg total) by mouth every 6 (six) hours. 02/05/14   Louann SjogrenVictoria L Lenoria Narine, PA-C  traMADol (ULTRAM) 50 MG tablet Take 1 tablet (50 mg total) by mouth every 6 (six) hours as needed. Patient not taking: Reported on 02/05/2014 09/30/13   Arman FilterGail K Schulz, NP   BP 154/98 mmHg  Pulse 87  Temp(Src) 98.2 F (36.8 C) (Oral)  Resp 16  Ht 5\' 4"  (1.626 m)  Wt 336 lb (152.409 kg)  BMI 57.65 kg/m2  SpO2 97%  LMP 09/20/2013 (Approximate) Physical Exam  Constitutional: She appears well-developed and well-nourished. No distress.  HENT:  Head: Normocephalic and atraumatic.  Mouth/Throat: Oropharynx is clear and moist.  Eyes: Conjunctivae and EOM are normal. Right eye exhibits no discharge. Left eye exhibits no discharge.  Cardiovascular: Normal rate, regular rhythm and normal heart sounds.   Pulmonary/Chest: Effort normal and breath sounds normal. No respiratory distress. She has no wheezes.  Abdominal: Soft. Bowel sounds are normal. She exhibits no distension.  Left lower quadrant abdominal tenderness without rebound, rigidity, guarding. No CVA tenderness or back tenderness.  Genitourinary:  Cervix pink without lesions. Os closed. No CMT mild left adnexal tenderness. No right adnexal tenderness or masses noted bilaterally. Moderate opaque discharge with fishy foul odor. Nurse in room for exam.  Neurological: She is alert. She exhibits normal muscle tone. Coordination normal.  Skin: Skin is warm and dry. She is not diaphoretic.  Nursing note and vitals reviewed.   ED Course  Procedures (including critical care time) Labs Review Labs Reviewed  WET PREP, GENITAL - Abnormal; Notable for the following:    Clue Cells Wet Prep HPF POC FEW (*)    WBC, Wet Prep HPF POC FEW (*)    All other components within normal limits  CBC WITH DIFFERENTIAL - Abnormal; Notable for the following:    WBC 10.8 (*)    All other  components within normal limits  COMPREHENSIVE METABOLIC PANEL - Abnormal; Notable for the following:    Glucose, Bld 105 (*)    Anion gap 16 (*)    All other components within normal limits  URINALYSIS, ROUTINE W REFLEX MICROSCOPIC - Abnormal; Notable for the following:    APPearance CLOUDY (*)    Ketones, ur 15 (*)    Protein, ur >300 (*)    All other components within normal limits  URINE MICROSCOPIC-ADD ON - Abnormal; Notable for the following:    Squamous Epithelial / LPF MANY (*)    Bacteria, UA MANY (*)    All other components within normal limits  GC/CHLAMYDIA PROBE AMP  PREGNANCY, URINE  RPR  HIV ANTIBODY (ROUTINE TESTING)    Imaging Review [place] Transvaginal Non-ob  02/05/2014   CLINICAL DATA:  Left lower quadrant abdominal tenderness  EXAM: TRANSABDOMINAL AND TRANSVAGINAL ULTRASOUND OF PELVIS  TECHNIQUE: Both transabdominal and transvaginal ultrasound examinations of the pelvis were performed. Transabdominal technique was performed for global imaging of the pelvis including uterus, ovaries, adnexal regions, and pelvic cul-de-sac. It was  necessary to proceed with endovaginal exam following the transabdominal exam to visualize the ovaries.  COMPARISON:  None  FINDINGS: Uterus  Measurements: 7 x 3 x 4 cm. No fibroids or other mass visualized.  Endometrium  Thickness: 4 mm.  No focal abnormality visualized.  Right ovary  Not visualized  Left ovary  Not visualized  Other findings  No free fluid.  IMPRESSION: 1. The ovaries could not be visualized due to bowel gas. The status of a previously described left adnexal mass is uncertain. 2. Negative uterus.   Electronically Signed   By: Tiburcio PeaJonathan  Watts M.D.   On: 02/05/2014 23:25   [place] Pelvis Complete  02/05/2014   CLINICAL DATA:  Left lower quadrant abdominal tenderness  EXAM: TRANSABDOMINAL AND TRANSVAGINAL ULTRASOUND OF PELVIS  TECHNIQUE: Both transabdominal and transvaginal ultrasound examinations of the pelvis were performed.  Transabdominal technique was performed for global imaging of the pelvis including uterus, ovaries, adnexal regions, and pelvic cul-de-sac. It was necessary to proceed with endovaginal exam following the transabdominal exam to visualize the ovaries.  COMPARISON:  None  FINDINGS: Uterus  Measurements: 7 x 3 x 4 cm. No fibroids or other mass visualized.  Endometrium  Thickness: 4 mm.  No focal abnormality visualized.  Right ovary  Not visualized  Left ovary  Not visualized  Other findings  No free fluid.  IMPRESSION: 1. The ovaries could not be visualized due to bowel gas. The status of a previously described left adnexal mass is uncertain. 2. Negative uterus.   Electronically Signed   By: Tiburcio PeaJonathan  Watts M.D.   On: 02/05/2014 23:25     EKG Interpretation None      MDM   Final diagnoses:  LLQ abdominal tenderness  Proteinuria  Nausea  Bacterial vaginosis   Patient presenting with left lower quadrant abdominal tenderness with associated nausea. She states she has had this before and had CT and ultrasound in August. US with dermoid cyst on left ovary with note of possible increased risk of torsion. Repeat ultrasound was recommended at that time in 6-12 weeks. VSS. Mild left lower quadrant abdominal tenderness without signs of peritonitis. Patient without nonsurgical abdomen. Pelvic exam with normal discomfort. Mild left adnexal tenderness. Patient's pain improved with Toradol. Labs reassuring. Patient with mild leukocytosis at 10.8. No electrolyte abnormalities. Urine with contaminant and not infection. Wet prep with few clue cells and few white blood cells. Patient without CMT and I doubt PID. Pt diagnosed with BV.  Ultrasound ordered to rule out torsion due to history of left ovarian cyst. US could not visualize ovaries due to bowel gas. On repeat exam, pt nontender and denies any nausea or pain. Exam not consistent with ovarian torsion. [facility]  pain. Patient is afebrile, nontoxic, and in no acute distress. Patient is stable for discharge.  Patient also with proteinuria. She has diabetes but does not take anything for it. Glucose 105 today. She currently does not have a primary care provider. Referral to the wellness center to establish care and reevaluate.  Discussed return precautions with patient. Discussed all results and patient verbalizes understanding and agrees with plan.  This is a shared patient. This patient was discussed with the physician, Dr. Loretha StaplerWofford who saw and evaluated the patient and agrees with the plan.     Louann SjogrenVictoria L Lamyia Cdebaca, PA-C 02/06/14 0034  Louann SjogrenVictoria L Zophia Marrone, PA-C 02/06/14 [id number]  Warnell Foresterrey Wofford, MD 02/06/14 [phone]

## 2014-02-05 NOTE — ED Notes (Signed)
Jessica Terrell, tech is escorting patient to ultrasound as chaperone.

## 2014-02-05 NOTE — ED Notes (Signed)
Patient here with complaint of LLQ abdominal pain which began about 2 days ago. States that she has been taking tylenol without effect at home. States previous history of the same but was not seen by MD and the pain didn't last as long that time. Denies vomiting but states nausea present. LMP: August(patient is on Depo injections), Last BM today.

## 2014-02-06 LAB — GC/CHLAMYDIA PROBE AMP
CT Probe RNA: NEGATIVE
GC PROBE AMP APTIMA: NEGATIVE

## 2014-02-06 LAB — RPR

## 2014-02-06 LAB — HIV ANTIBODY (ROUTINE TESTING W REFLEX): HIV 1&2 Ab, 4th Generation: NONREACTIVE

## 2014-02-06 NOTE — ED Provider Notes (Signed)
Medical screening examination/treatment/procedure(s) were conducted as a shared visit with non-physician practitioner(s) and myself.  I personally evaluated the patient during the encounter.   EKG Interpretation None      28 year old female who presents with left lower quadrant abdominal pain. Started 2 days ago and has been intermittent. Initially began as mild, but progressed to be more severe. No vomiting, but has had some nausea. No vaginal discharge, no vaginal bleeding, no diarrhea, no fevers.  At time my interview, her pain had resolved.  On exam, well appearing, nontoxic, not distressed, normal respiratory effort, normal perfusion, abdomen soft and nontender. Due to her prior diagnosis of left ovarian cyst, attempted to obtain ultrasound to reevaluate this. However, her ovaries were unable to be visualized. Her exam was not consistent with ovarian torsion. However, I did strictly warn her to return for further testing if her abdominal pain should recur. Patient was in agreement with the plan.  Clinical Impression: 1. Proteinuria   2. LLQ abdominal tenderness   3. Nausea   4. Bacterial vaginosis       Jessica Foresterrey Torianna Junio, MD 02/06/14 [phone]

## 2014-02-06 NOTE — Discharge Instructions (Signed)
Return to the emergency room or [facility] with worsening of symptoms, new symptoms or with symptoms that are concerning, especially fevers, returning severe pain, nausea, unable to tolerate fluids by mouth. Call to make a [facility] in 2 days. Number provided above. Please take all of your antibiotics until finished!   You may develop abdominal discomfort or diarrhea from the antibiotic.  You may help offset this with probiotics which you can buy or get in yogurt. Do not eat  or take the probiotics until 2 hours after your antibiotic. Do not drink alcohol while taking Flagyl as it will make you very sick which can include nausea vomiting and abdominal pain.   Abdominal Pain, Women Abdominal (stomach, pelvic, or belly) pain can be caused by many things. It is important to tell your doctor:  The location of the pain.  Does it come and go or is it present all the time?  Are there things that start the pain (eating certain foods, exercise)?  Are there other symptoms associated with the pain (fever, nausea, vomiting, diarrhea)? All of this is helpful to know when trying to find the cause of the pain. CAUSES   Stomach: virus or bacteria infection, or ulcer.  Intestine: appendicitis (inflamed appendix), regional ileitis (Crohn's disease), ulcerative colitis (inflamed colon), irritable bowel syndrome, diverticulitis (inflamed diverticulum of the colon), or cancer of the stomach or intestine.  Gallbladder disease or stones in the gallbladder.  Kidney disease, kidney stones, or infection.  Pancreas infection or cancer.  Fibromyalgia (pain disorder).  Diseases of the female organs:  Uterus: fibroid (non-cancerous) tumors or infection.  Fallopian tubes: infection or tubal pregnancy.  Ovary: cysts or tumors.  Pelvic adhesions (scar tissue).  Endometriosis (uterus lining tissue growing in the pelvis and on the pelvic  organs).  Pelvic congestion syndrome (female organs filling up with blood just before the menstrual period).  Pain with the menstrual period.  Pain with ovulation (producing an egg).  Pain with an IUD (intrauterine device, birth control) in the uterus.  Cancer of the female organs.  Functional pain (pain not caused by a disease, may improve without treatment).  Psychological pain.  Depression. DIAGNOSIS  Your doctor will decide the seriousness of your pain by doing an examination.  Blood tests.  X-rays.  Ultrasound.  CT scan (computed tomography, special type of X-ray).  MRI (magnetic resonance imaging).  Cultures, for infection.  Barium enema (dye inserted in the large intestine, to better view it with X-rays).  Colonoscopy (looking in intestine with a lighted tube).  Laparoscopy (minor surgery, looking in abdomen with a lighted tube).  Major abdominal exploratory surgery (looking in abdomen with a large incision). TREATMENT  The treatment will depend on the cause of the pain.   Many cases can be observed and treated at home.  Over-the-counter medicines recommended by your caregiver.  Prescription medicine.  Antibiotics, for infection.  Birth control pills, for painful periods or for ovulation pain.  Hormone treatment, for endometriosis.  Nerve blocking injections.  Physical therapy.  Antidepressants.  Counseling with a psychologist or psychiatrist.  Minor or major surgery. HOME CARE INSTRUCTIONS   Do not take laxatives, unless directed by your caregiver.  Take over-the-counter pain medicine only if ordered by your caregiver. Do not take aspirin because it can cause an upset stomach or bleeding.  Try a clear liquid diet (broth or water) as ordered by your caregiver. Slowly move to a bland diet, as tolerated,  if the pain is related to the stomach or intestine.  Have a thermometer and take your temperature several times a day, and record  it.  Bed rest and sleep, if it helps the pain.  Avoid sexual intercourse, if it causes pain.  Avoid stressful situations.  Keep your follow-up appointments and tests, as your caregiver orders.  If the pain does not go away with medicine or surgery, you may try:  Acupuncture.  Relaxation exercises (yoga, meditation).  Group therapy.  Counseling. SEEK MEDICAL CARE IF:   You notice certain foods cause stomach pain.  Your home care treatment is not helping your pain.  You need stronger pain medicine.  You want your IUD removed.  You feel faint or lightheaded.  You develop nausea and vomiting.  You develop a rash.  You are having side effects or an allergy to your medicine. SEEK IMMEDIATE MEDICAL CARE IF:   Your pain does not go away or gets worse.  You have a fever.  Your pain is felt only in portions of the abdomen. The right side could possibly be appendicitis. The left lower portion of the abdomen could be colitis or diverticulitis.  You are passing blood in your stools (bright red or black tarry stools, with or without vomiting).  You have blood in your urine.  You develop chills, with or without a fever.  You pass out. MAKE SURE YOU:   Understand these instructions.  Will watch your condition.  Will get help right away if you are not doing well or get worse. Document Released: 12/04/2006 Document Revised: 06/23/2013 Document Reviewed: 12/24/2008 [facility] Patient Information 2015 [employer] [place]. This information is not intended to replace advice given to you by your health care provider. Make sure you discuss any questions you have with your health care provider.

## 2014-03-15 ENCOUNTER — Encounter (HOSPITAL_COMMUNITY): Payer: Self-pay | Admitting: *Deleted

## 2014-03-15 ENCOUNTER — Emergency Department (HOSPITAL_COMMUNITY)
Admission: EM | Admit: 2014-03-15 | Discharge: 2014-03-15 | Disposition: A | Discharge status: Home / Self Care | Payer: BLUE CROSS/BLUE SHIELD | Attending: Emergency Medicine | Admitting: Emergency Medicine

## 2014-03-15 ENCOUNTER — Emergency Department (HOSPITAL_COMMUNITY): Payer: Self-pay

## 2014-03-15 DIAGNOSIS — Z791 Long term (current) use of non-steroidal anti-inflammatories (NSAID): Secondary | ICD-10-CM | POA: Insufficient documentation

## 2014-03-15 DIAGNOSIS — M25562 Pain in left knee: Secondary | ICD-10-CM

## 2014-03-15 DIAGNOSIS — Y998 Other external cause status: Secondary | ICD-10-CM | POA: Insufficient documentation

## 2014-03-15 DIAGNOSIS — Y93E1 Activity, personal bathing and showering: Secondary | ICD-10-CM | POA: Insufficient documentation

## 2014-03-15 DIAGNOSIS — Y9289 Other specified places as the place of occurrence of the external cause: Secondary | ICD-10-CM | POA: Insufficient documentation

## 2014-03-15 DIAGNOSIS — E119 Type 2 diabetes mellitus without complications: Secondary | ICD-10-CM | POA: Insufficient documentation

## 2014-03-15 DIAGNOSIS — E669 Obesity, unspecified: Secondary | ICD-10-CM | POA: Insufficient documentation

## 2014-03-15 DIAGNOSIS — Z79899 Other long term (current) drug therapy: Secondary | ICD-10-CM | POA: Insufficient documentation

## 2014-03-15 DIAGNOSIS — W182XXA Fall in (into) shower or empty bathtub, initial encounter: Secondary | ICD-10-CM | POA: Insufficient documentation

## 2014-03-15 DIAGNOSIS — I10 Essential (primary) hypertension: Secondary | ICD-10-CM | POA: Insufficient documentation

## 2014-03-15 DIAGNOSIS — S8992XA Unspecified injury of left lower leg, initial encounter: Secondary | ICD-10-CM | POA: Insufficient documentation

## 2014-03-15 MED ORDER — TRAMADOL-ACETAMINOPHEN 37.5-325 MG PO TABS: 1.0000 | ORAL_TABLET | Freq: Four times a day (QID) | ORAL | Status: DC | PRN

## 2014-03-15 MED ORDER — OXYCODONE-ACETAMINOPHEN 5-325 MG PO TABS
1.0000 | ORAL_TABLET | Freq: Once | ORAL | Status: AC
  Administered 2014-03-15: 1 via ORAL
  Filled 2014-03-15: qty 1

## 2014-03-15 NOTE — ED Provider Notes (Signed)
CSN: [id number]     Arrival date & time 03/15/14  1450 History  This chart was scribed for non-physician practitioner, Sharilyn SitesLisa Raisa Ditto, PA-C working with Toy CookeyMegan Docherty, MD by Greggory StallionKayla Andersen, ED scribe. This patient was seen in room TR10C/TR10C and the patient's care was started at 4:10 PM.   Chief Complaint  Patient presents with  . Knee Pain   The history is provided by the patient. No language interpreter was used.    HPI Comments: Jessica Terrell is a 29 y.o. female who presents to the Emergency Department complaining of left knee pain that started yesterday after slipping in the shower and hitting her knee on the tub. No head injury or LOC.  Reports associated mild swelling. Movements and bearing weight worsen pain. Pt has not yet done anything for her symptoms.  Denies numbness/paresthesias.  Past Medical History  Diagnosis Date  . Obesity   . Hypertension   . Diabetes mellitus without complication    Past Surgical History  Procedure Laterality Date  . Cholecystectomy    . Tonsillectomy    . Knee surgery     History reviewed. No pertinent family history. History  Substance Use Topics  . Smoking status: Never Smoker   . Smokeless tobacco: Not on file  . Alcohol Use: No   OB History    No data available     Review of Systems  Musculoskeletal: Positive for arthralgias.  All other systems reviewed and are negative.  Allergies  Review of patient's allergies indicates no known allergies.  Home Medications   Prior to Admission medications   Medication Sig Start Date End Date Taking? Authorizing Provider  acetaminophen (TYLENOL) 325 MG tablet Take 650 mg by mouth every 6 (six) hours as needed for mild pain.    Historical Provider, MD  cetirizine (ZYRTEC) 10 MG tablet Take 1 tablet (10 mg total) by mouth daily. Patient not taking: Reported on 02/05/2014 11/14/13   Graylon GoodZachary H Baker, PA-C  clindamycin (CLEOCIN) 300 MG capsule Take 1 capsule (300 mg total) by mouth 3 (three) times  daily. Patient not taking: Reported on 02/05/2014 12/15/13   Linna HoffJames D Kindl, MD  cyclobenzaprine (FLEXERIL) 10 MG tablet Take 1 tablet (10 mg total) by mouth 2 (two) times daily as needed for muscle spasms. Patient not taking: Reported on 02/05/2014 10/30/13   Elwin MochaBlair Walden, MD  fluticasone [facility](FLONASE) 50 MCG/ACT nasal spray Place 2 sprays into both nostrils 2 (two) times daily. Decrease to 2 sprays/nostril daily after 5 days Patient not taking: Reported on 02/05/2014 11/14/13   Graylon GoodZachary H Baker, PA-C  ibuprofen (ADVIL,MOTRIN) 800 MG tablet Take 1 tablet (800 mg total) by mouth 3 (three) times daily. Patient not taking: Reported on 02/05/2014 10/30/13   Elwin MochaBlair Walden, MD  metroNIDAZOLE (FLAGYL) 500 MG tablet Take 1 tablet (500 mg total) by mouth 2 (two) times daily. 02/05/14   Louann SjogrenVictoria L Creech, PA-C  naproxen (NAPROSYN) 375 MG tablet Take 1 tablet (375 mg total) by mouth 2 (two) times daily with a meal. Patient not taking: Reported on 02/05/2014 09/30/13   Arman FilterGail K Schulz, NP  ondansetron (ZOFRAN ODT) 8 MG disintegrating tablet Take 1 tablet (8 mg total) by mouth every 8 (eight) hours as needed for nausea or vomiting. Patient not taking: Reported on 02/05/2014 11/14/13   Graylon GoodZachary H Baker, PA-C  ondansetron (ZOFRAN) 4 MG tablet Take 1 tablet (4 mg total) by mouth every 6 (six) hours. 02/05/14   Louann SjogrenVictoria L Creech, PA-C  traMADol (ULTRAM) 50 MG  tablet Take 1 tablet (50 mg total) by mouth every 6 (six) hours as needed. Patient not taking: Reported on 02/05/2014 09/30/13   Arman Filter, NP   BP 151/65 mmHg  Pulse 105  Temp(Src) 98.2 F (36.8 C) (Oral)  Resp 18  SpO2 99%   Physical Exam  Constitutional: She is oriented to person, place, and time. She appears well-developed and well-nourished. No distress.  HENT:  Head: Normocephalic and atraumatic.  Mouth/Throat: Oropharynx is clear and moist.  Eyes: Conjunctivae and EOM are normal. Pupils are equal, round, and reactive to light.  Neck: Normal range of  motion.  Cardiovascular: Normal rate, regular rhythm and normal heart sounds.   Pulmonary/Chest: Effort normal and breath sounds normal. No respiratory distress. She has no wheezes.  Musculoskeletal: Normal range of motion. She exhibits no edema.       Left knee: She exhibits no swelling, no effusion, no ecchymosis, no deformity and no laceration. Tenderness found. Medial joint line tenderness noted.       Legs: Left knee with mild tenderness along medial margins; no obvious swelling or bony deformities; ambulating without difficulty; leg remains NVI  Neurological: She is alert and oriented to person, place, and time.  Skin: Skin is warm and dry. She is not diaphoretic.  Psychiatric: She has a normal mood and affect.  Nursing note and vitals reviewed.   ED Course  Procedures (including critical care time)  DIAGNOSTIC STUDIES: Oxygen Saturation is 99% on RA, normal by my interpretation.    COORDINATION OF CARE: 4:11 PM-Advised pt of xray results. Crutches offered and pt declined. Discussed treatment plan which includes ACE wrap and pain control with pt at bedside and pt agreed to plan. Will give pt an orthopedic referral and advised her to follow up if symptoms do not start resolving.   Labs Review Labs Reviewed - No data to display  Imaging Review Dg Knee Complete 4 Views Left  03/15/2014   CLINICAL DATA:  Left knee pain, slipped in shower in, anterior knee pain  EXAM: LEFT KNEE - COMPLETE 4+ VIEW  COMPARISON:  None.  FINDINGS: Four views of the left knee submitted. No acute fracture or subluxation. There is spurring of lateral femoral condyle and lateral tibial plateau. Joint space is preserved.  IMPRESSION: No acute fracture or subluxation. Degenerative changes lateral joint compartment.   Electronically Signed   By: Natasha Mead M.D.   On: 03/15/2014 15:50     EKG Interpretation None      MDM   Final diagnoses:  Knee pain, acute, left   29 year old female with slip and fall  while getting out of the shower yesterday. Her loss of consciousness. Imaging obtained which is negative for acute findings. Patient ambulatory without difficulty. She was provided with an Ace wrap and given orthopedic follow-up if no improvement of symptoms within the next week. Recommended RICE routine, ultracet for pain.  Discussed plan with patient, he/she acknowledged understanding and agreed with plan of care.  Return precautions given for new or worsening symptoms.  I personally performed the services described in this documentation, which was scribed in my presence. The recorded information has been reviewed and is accurate.  Garlon Hatchet, PA-C 03/15/14 1703  Toy Cookey, MD 03/16/14 2150

## 2014-03-15 NOTE — ED Notes (Signed)
Pt reports slipping yesterday when getting out of the shower, having left knee pain. Ambulatory at triage.

## 2014-03-15 NOTE — ED Notes (Signed)
Declined W/C at D/C and was escorted to lobby by RN. 

## 2014-03-15 NOTE — Discharge Instructions (Signed)
Take the prescribed medication as directed.  Wear ace wrap to help with pain/swelling.  May also wish to ice/elevate knee. Follow-up with orthopedics if no improvement in 1 week.   Return to the ED for new or worsening symptoms.

## 2014-04-15 ENCOUNTER — Emergency Department (HOSPITAL_BASED_OUTPATIENT_CLINIC_OR_DEPARTMENT_OTHER)
Admission: EM | Admit: 2014-04-15 | Discharge: 2014-04-15 | Disposition: A | Discharge status: Home / Self Care | Payer: BLUE CROSS/BLUE SHIELD | Attending: Emergency Medicine | Admitting: Emergency Medicine

## 2014-04-15 ENCOUNTER — Encounter (HOSPITAL_BASED_OUTPATIENT_CLINIC_OR_DEPARTMENT_OTHER): Payer: Self-pay

## 2014-04-15 DIAGNOSIS — I10 Essential (primary) hypertension: Secondary | ICD-10-CM | POA: Insufficient documentation

## 2014-04-15 DIAGNOSIS — Z3202 Encounter for pregnancy test, result negative: Secondary | ICD-10-CM | POA: Insufficient documentation

## 2014-04-15 DIAGNOSIS — R112 Nausea with vomiting, unspecified: Secondary | ICD-10-CM | POA: Insufficient documentation

## 2014-04-15 DIAGNOSIS — N390 Urinary tract infection, site not specified: Secondary | ICD-10-CM | POA: Insufficient documentation

## 2014-04-15 DIAGNOSIS — E118 Type 2 diabetes mellitus with unspecified complications: Secondary | ICD-10-CM | POA: Insufficient documentation

## 2014-04-15 DIAGNOSIS — E669 Obesity, unspecified: Secondary | ICD-10-CM | POA: Insufficient documentation

## 2014-04-15 LAB — LIPASE, BLOOD: Lipase: 37 U/L (ref 11–59)

## 2014-04-15 LAB — URINALYSIS, ROUTINE W REFLEX MICROSCOPIC
BILIRUBIN URINE: NEGATIVE
Glucose, UA: NEGATIVE mg/dL
Ketones, ur: 15 mg/dL — AB
Nitrite: NEGATIVE
Specific Gravity, Urine: 1.034 — ABNORMAL HIGH (ref 1.005–1.030)
UROBILINOGEN UA: 1 mg/dL (ref 0.0–1.0)
pH: 5.5 (ref 5.0–8.0)

## 2014-04-15 LAB — URINE MICROSCOPIC-ADD ON

## 2014-04-15 LAB — BASIC METABOLIC PANEL
ANION GAP: 0 — AB (ref 5–15)
BUN: 11 mg/dL (ref 6–23)
CALCIUM: 8.6 mg/dL (ref 8.4–10.5)
CHLORIDE: 107 mmol/L (ref 96–112)
CO2: 26 mmol/L (ref 19–32)
CREATININE: 1.04 mg/dL (ref 0.50–1.10)
GFR calc non Af Amer: 73 mL/min — ABNORMAL LOW (ref >90)
GFR, EST AFRICAN AMERICAN: 85 mL/min — AB (ref >90)
Glucose, Bld: 111 mg/dL — ABNORMAL HIGH (ref 70–99)
Potassium: 3.8 mmol/L (ref 3.5–5.1)
SODIUM: 133 mmol/L — AB (ref 135–145)

## 2014-04-15 LAB — CBC
HCT: 46.1 % — ABNORMAL HIGH (ref 36.0–46.0)
Hemoglobin: 16 g/dL — ABNORMAL HIGH (ref 12.0–15.0)
MCH: 29.3 pg (ref 26.0–34.0)
MCHC: 34.7 g/dL (ref 30.0–36.0)
MCV: 84.3 fL (ref 78.0–100.0)
PLATELETS: 305 10*3/uL (ref 150–400)
RBC: 5.47 MIL/uL — ABNORMAL HIGH (ref 3.87–5.11)
RDW: 13.2 % (ref 11.5–15.5)
WBC: 11 10*3/uL — ABNORMAL HIGH (ref 4.0–10.5)

## 2014-04-15 LAB — PREGNANCY, URINE: PREG TEST UR: NEGATIVE

## 2014-04-15 MED ORDER — ONDANSETRON 4 MG PO TBDP: ORAL_TABLET | ORAL | Status: AC

## 2014-04-15 MED ORDER — SODIUM CHLORIDE 0.9 % IV BOLUS (SEPSIS)
1000.0000 mL | Freq: Once | INTRAVENOUS | Status: AC
  Administered 2014-04-15: 1000 mL via INTRAVENOUS

## 2014-04-15 MED ORDER — CEPHALEXIN 250 MG PO CAPS
500.0000 mg | ORAL_CAPSULE | Freq: Once | ORAL | Status: AC
  Administered 2014-04-15: 500 mg via ORAL
  Filled 2014-04-15: qty 2

## 2014-04-15 MED ORDER — CEPHALEXIN 500 MG PO CAPS: 500.0000 mg | ORAL_CAPSULE | Freq: Four times a day (QID) | ORAL | Status: AC

## 2014-04-15 MED ORDER — ONDANSETRON HCL 4 MG/2ML IJ SOLN
4.0000 mg | Freq: Once | INTRAMUSCULAR | Status: AC
  Administered 2014-04-15: 4 mg via INTRAVENOUS
  Filled 2014-04-15: qty 2

## 2014-04-15 NOTE — ED Notes (Signed)
Pt states she is past due depo inj "few weeks"-no period since being before depo and none in last 2 weeks

## 2014-04-15 NOTE — ED Notes (Signed)
Nausea x 3 days-vomited x 1 today-HA x this am

## 2014-04-15 NOTE — ED Notes (Signed)
Pt comes in today with a c/o nausea and vomiting. Pt states the nausea has been occurring for the past 3 days. Pt states while at work today she did have one episode of vomiting. Pt alert and oriented.

## 2014-04-15 NOTE — ED Provider Notes (Signed)
CSN: [id number]     Arrival date & time 04/15/14  1647 History   First MD Initiated Contact with Patient 04/15/14 1746     Chief Complaint  Patient presents with  . Nausea     (Consider location/radiation/quality/duration/timing/severity/associated sxs/prior Treatment) The history is provided by the patient and medical records. No language interpreter was used.     Jessica Terrell is a 29 y.o. female  with a hx of HTN, obesity, NIDDM presents to the Emergency Department complaining of gradual, persistent, progressively worsening nausea onset 3 days ago.  Pt reports she is not taking any medications for her diabetes because she does not have a PCP.  Pt reports her last dose of metformin was in August.  Pt does not know what her CBG is.  She reports polydipsia.  Pt denies abd pain, fever, chills, neck pain, neck stiffness, urinary frequency, urinary urgency, hematuria.  Pt reports she is sexually active with 1 female partner.  No hx of STD or vaginal discharge.  Associated symptoms include 1 episode of NBNB emesis today while at work.  Nothing makes it better and nothing  makes it worse.      Past Medical History  Diagnosis Date  . Obesity   . Hypertension   . Diabetes mellitus without complication    Past Surgical History  Procedure Laterality Date  . Cholecystectomy    . Tonsillectomy    . Knee surgery     No family history on file. History  Substance Use Topics  . Smoking status: Never Smoker   . Smokeless tobacco: Not on file  . Alcohol Use: No   OB History    No data available     Review of Systems  Constitutional: Negative for fever, diaphoresis, appetite change, fatigue and unexpected weight change.  HENT: Negative for mouth sores.   Eyes: Negative for visual disturbance.  Respiratory: Negative for cough, chest tightness, shortness of breath and wheezing.   Cardiovascular: Negative for chest pain.  Gastrointestinal: Positive for nausea and vomiting. Negative for  abdominal pain, diarrhea and constipation.  Endocrine: Positive for polydipsia. Negative for polyphagia and polyuria.  Genitourinary: Negative for dysuria, urgency, frequency and hematuria.  Musculoskeletal: Negative for back pain and neck stiffness.  Skin: Negative for rash.  Allergic/Immunologic: Negative for immunocompromised state.  Neurological: Negative for syncope, light-headedness and headaches.  Hematological: Does not bruise/bleed easily.  Psychiatric/Behavioral: Negative for sleep disturbance. The patient is not nervous/anxious.       Allergies  Review of patient's allergies indicates no known allergies.  Home Medications   Prior to Admission medications   Medication Sig Start Date End Date Taking? Authorizing Provider  cephALEXin (KEFLEX) 500 MG capsule Take 1 capsule (500 mg total) by mouth 4 (four) times daily. 04/15/14   Marisue Canion, PA-C  ondansetron (ZOFRAN ODT) 4 MG disintegrating tablet  ODT q4 hours prn nausea/vomit 04/15/14   Faige Seely, PA-C   BP 135/70 mmHg  Pulse 78  Temp(Src) 98.2 F (36.8 C) (Oral)  Resp 20  Ht  (1.626 m)  Wt 337 lb 4 oz (152.976 kg)  BMI 57.86 kg/m2  SpO2 100% Physical Exam  Constitutional: She appears well-developed and well-nourished. No distress.  Awake, alert, nontoxic appearance  HENT:  Head: Normocephalic and atraumatic.  Mouth/Throat: Oropharynx is clear and moist. No oropharyngeal exudate.  Eyes: Conjunctivae are normal. No scleral icterus.  Neck: Normal range of motion. Neck supple.  Cardiovascular: Normal rate, regular rhythm, normal heart sounds and intact distal  pulses.   No murmur heard. Pulmonary/Chest: Effort normal and breath sounds normal. No respiratory distress. She has no wheezes.  Equal chest expansion  Abdominal: Soft. Bowel sounds are normal. She exhibits no distension and no mass. There is no tenderness. There is no rebound and no guarding.  Musculoskeletal: Normal range of motion.  She exhibits no edema.  Neurological: She is alert.  Speech is clear and goal oriented Moves extremities without ataxia  Skin: Skin is warm and dry. She is not diaphoretic.  Psychiatric: She has a normal mood and affect.  Nursing note and vitals reviewed.   ED Course  Procedures (including critical care time) Labs Review Labs Reviewed  URINALYSIS, ROUTINE W REFLEX MICROSCOPIC - Abnormal; Notable for the following:    Color, Urine AMBER (*)    APPearance CLOUDY (*)    Specific Gravity, Urine 1.034 (*)    Hgb urine dipstick LARGE (*)    Ketones, ur 15 (*)    Protein, ur >300 (*)    Leukocytes, UA TRACE (*)    All other components within normal limits  URINE MICROSCOPIC-ADD ON - Abnormal; Notable for the following:    Squamous Epithelial / LPF MANY (*)    Bacteria, UA MANY (*)    All other components within normal limits  CBC - Abnormal; Notable for the following:    WBC 11.0 (*)    RBC 5.47 (*)    Hemoglobin 16.0 (*)    HCT 46.1 (*)    All other components within normal limits  BASIC METABOLIC PANEL - Abnormal; Notable for the following:    Sodium 133 (*)    Glucose, Bld 111 (*)    GFR calc non Af Amer 73 (*)    GFR calc Af Amer 85 (*)    Anion gap 0 (*)    All other components within normal limits  URINE CULTURE  PREGNANCY, URINE  LIPASE, BLOOD    Imaging Review No results found.   EKG Interpretation None      MDM   Final diagnoses:  UTI (lower urinary tract infection)  Non-intractable vomiting with nausea, vomiting of unspecified type  Type 2 diabetes mellitus with complication    Jessica Terrell presents with nausea, 1 episodes of emesis and a Hx of uncontrolled diabetes.  Will check urine, labs and reassess.  7:47 PM Patient with urinary tract infection. Mild leukocytosis of 11 however patient is hemoconcentrated likely secondary to her diabetes. Slightly elevated blood glucose however patient without evidence of DKA. Fluids given. Patient tolerating by  mouth here in the department without difficulty. Abdomen is soft and nontender without rebound or peritoneal signs.  No evidence of ectopic pregnancy. Highly doubt appendicitis, diverticulitis. Patient potentially with viral gastritis.  Pt is without diarrhea.    Discussed importance of primary care follow-up. As patient's blood sugar is approximately 100 today will not begin metformin. Recommend the patient see [place] [facility].  I have personally reviewed patient's vitals, nursing note and any pertinent labs or imaging.  I performed an undressed physical exam.    It has been determined that no acute conditions requiring further emergency intervention are present at this time. The patient/guardian have been advised of the diagnosis and plan. I reviewed all labs and imaging including any potential incidental findings. We have discussed signs and symptoms that warrant return to the ED and they are listed in the discharge instructions.    Vital signs are stable at discharge.   BP 135/70  mmHg  Pulse 78  Temp(Src) 98.2 F (36.8 C) (Oral)  Resp 20  Ht  (1.626 m)  Wt 337 lb 4 oz (152.976 kg)  BMI 57.86 kg/m2  SpO2 100%        Dierdre Forth, PA-C 04/15/14 1949  Glynn Octave, MD 04/15/14 2348

## 2014-04-15 NOTE — Discharge Instructions (Signed)
1. Medications: Keflex, Zofran, usual home medications 2. Treatment: rest, drink plenty of fluids, take medications as prescribed 3. Follow Up: Please followup with your primary doctor in 3 days for discussion of your diagnoses and further evaluation after today's visit; if you do not have a primary care doctor use the resource guide provided to find one; return to the ER for fevers, persistent vomiting, worsening abdominal pain or other concerning symptoms.  Urinary Tract Infection Urinary tract infections (UTIs) can develop anywhere along your urinary tract. Your urinary tract is your body's drainage system for removing wastes and extra water. Your urinary tract includes two kidneys, two ureters, a bladder, and a urethra. Your kidneys are a pair of bean-shaped organs. Each kidney is about the size of your fist. They are located below your ribs, one on each side of your spine. CAUSES Infections are caused by microbes, which are microscopic organisms, including fungi, viruses, and bacteria. These organisms are so small that they can only be seen through a microscope. Bacteria are the microbes that most commonly cause UTIs. SYMPTOMS  Symptoms of UTIs may vary by age and gender of the patient and by the location of the infection. Symptoms in young women typically include a frequent and intense urge to urinate and a painful, burning feeling in the bladder or urethra during urination. Older women and men are more likely to be tired, shaky, and weak and have muscle aches and abdominal pain. A fever may mean the infection is in your kidneys. Other symptoms of a kidney infection include pain in your back or sides below the ribs, nausea, and vomiting. DIAGNOSIS To diagnose a UTI, your caregiver will ask you about your symptoms. Your caregiver also will ask to provide a urine sample. The urine sample will be tested for bacteria and white blood cells. White blood cells are made by your body to help fight  infection. TREATMENT  Typically, UTIs can be treated with medication. Because most UTIs are caused by a bacterial infection, they usually can be treated with the use of antibiotics. The choice of antibiotic and length of treatment depend on your symptoms and the type of bacteria causing your infection. HOME CARE INSTRUCTIONS  If you were prescribed antibiotics, take them exactly as your caregiver instructs you. Finish the medication even if you feel better after you have only taken some of the medication.  Drink enough water and fluids to keep your urine clear or pale yellow.  Avoid caffeine, tea, and carbonated beverages. They tend to irritate your bladder.  Empty your bladder often. Avoid holding urine for long periods of time.  Empty your bladder before and after sexual intercourse.  After a bowel movement, women should cleanse from front to back. Use each tissue only once. SEEK MEDICAL CARE IF:   You have back pain.  You develop a fever.  Your symptoms do not begin to resolve within 3 days. SEEK IMMEDIATE MEDICAL CARE IF:   You have severe back pain or lower abdominal pain.  You develop chills.  You have nausea or vomiting.  You have continued burning or discomfort with urination. MAKE SURE YOU:   Understand these instructions.  Will watch your condition.  Will get help right away if you are not doing well or get worse. Document Released: 11/16/2004 Document Revised: 08/08/2011 Document Reviewed: 03/17/2011 [facility] Patient Information 2015 [place], [place]. This information is not intended to replace advice given to you by your health care provider. Make sure you discuss any questions  you have with your health care provider.    Emergency Department Resource Guide 1) Find a Doctor and Pay Out of Pocket Although you won't have to find out who is covered by your insurance plan, it is a good idea to ask around and get recommendations. You will then need to call the  office and see if the doctor you have chosen will accept you as a new patient and what types of options they offer for patients who are self-pay. Some doctors offer discounts or will set up payment plans for their patients who do not have insurance, but you will need to ask so you aren't surprised when you get to your appointment.  2) Contact Your Local Health Department Not all health departments have doctors that can see patients for sick visits, but many do, so it is worth a call to see if yours does. If you don't know where your local health department is, you can check in your phone book. The CDC also has a tool to help you locate your state's health department, and many state websites also have listings of all of their local health departments.  3) Find a Walk-in Clinic If your illness is not likely to be very severe or complicated, you may want to try a walk in clinic. These are popping up all over the country in pharmacies, drugstores, and shopping centers. They're usually staffed by nurse practitioners or physician assistants that have been trained to treat common illnesses and complaints. They're usually fairly quick and inexpensive. However, if you have serious medical issues or chronic medical problems, these are probably not your best option.  No Primary Care Doctor: - Call Health Connect at  [phone] - they can help you locate a primary care doctor that  accepts your insurance, provides certain services, etc. - Physician Referral Service- [phone]  Chronic Pain Problems: Organization         Address  Phone   Notes  [facility]  [phone] Patients need to be referred by their primary care doctor.   Medication Assistance: Organization         Address  Phone   Notes  [facility] Medication [facility] [address] [place] [place], [place] [address] [phone] --Must be a resident of [facility] -- Must have NO insurance coverage  whatsoever (no Medicaid/ Medicare, etc.) -- The pt. MUST have a primary care doctor that directs their care regularly and follows them in the community   [facility]  [phone]   [employer]  [phone]    Agencies that provide inexpensive medical care: Organization         Address  Phone   Notes  [facility]  [phone]   [facility]    [phone]   [facility] [address] [place], [place] [address] [phone]   [facility] [address] [place] [phone]   [facility]anned Parenthood    [phone]   [facility]    [phone]   [facility]  [address], [place] Phone:  [phone], Fax:  [phone] Hours of Operation:  9 am - 6 pm, M-F.  Also accepts Medicaid/Medicare and self-pay.  [facility]  [address], [place] Phone: [phone], Fax: [phone]. Hours of Operation:  8:30 am - 5:30 pm, M-F.  Also accepts Medicaid and self-pay.  [facility] [place] [address] [place] Phone: [phone]   [facility] [address] [place], [place] [phone], Ext. [phone] Mondays & Thursdays: 7-9 AM.  First 15 patients are seen on a first come, first serve basis.    Medicaid-accepting [facility] Providers:  Organization         Address  Phone   Notes  [facility] [address], [place] [phone] Also accepts self-pay patients.  [facility] [address] Laurell Josephs [place], [place]  [phone]   [facility] [address], [place] [phone]   [facility] [address] [place] [phone]   Renaye Rakers [address], [place]   [phone] Only accepts [place] Access [place] patients after they have their name applied  to their card.   Self-Pay (no insurance) in [facility]:  Organization         Address  Phone   Notes  Sickle Cell Patients, [facility] [address] [place], [place] [phone]   [facility] Urgent Care [address] [place], [place] [phone]   [facility]  [address], [place] [phone]   [facility]/Dr. Osei-Bonsu  [address] [place] or [address], [place] [phone] Phone number for both [place] and [place] locations is the same.  [facility]gent Medical and [facility] [address], [place] [phone]   [facility] [address] [place] or [address] [phone]   [facility] [address] [place] [phone], phone; [phone], fax Sees patients 1st and 3rd Saturday of every month.  Must not qualify for public or private insurance (i.e. Medicaid, Medicare, [place] Health Choice, Veterans' Benefits)  Household income should be no more than 200% of the poverty level The clinic cannot treat you if you are pregnant or think you are pregnant  Sexually transmitted diseases are not treated at the clinic.    Dental Care: Organization         Address  Phone  Notes  [facility] Department of [facility] [address] [place], [place] [phone] Accepts children up to age 28 who are enrolled in [place] or [employer] Health Choice; pregnant women with a Medicaid card; and children who have applied for Medicaid or [place] Health Choice, but were declined, whose parents can pay a reduced fee at time of service.  [facility] Department of [facility]  [address], [place] [phone] Accepts children up to age 56 who are enrolled in [place] or [place] Health Choice; pregnant women with a Medicaid card; and children who have applied for Medicaid or [place]  Health Choice, but were declined, whose parents can pay a reduced fee at time of service.  [facility]  [address] [place], [place] [phone] Patients are seen by appointment only. Walk-ins are not accepted. [facility] will see patients 16 years of age and older. Monday - Tuesday (8am-5pm) Most Wednesdays (8:30-5pm) $30 per visit, cash only  [facility]  [address], [facility] [phone] Patients are seen by appointment only. Walk-ins are not accepted. [facility]  will see patients 83 years of age and older. One Wednesday Evening (Monthly: Volunteer Based).  $30 per visit, cash only  [employer]  [phone] for adults; Children under age 8, call Graduate Pediatric Dentistry at [phone]. Children aged 54-14, please call [phone] to request a pediatric application.  Dental services are provided in all areas of dental care including fillings, crowns and bridges, complete and partial dentures, implants, gum treatment, root canals, and extractions. Preventive care is also provided. Treatment is provided to both adults and children. Patients are selected via a lottery and there is often a waiting list.   [facility] [address], [place]  [phone] [email or web address]   [facility] [address] [place], [place] [phone], Ext. [phone] Second and Fourth Thursday of each month, opens at 6:30 AM; Clinic ends at 9 AM.  Patients are seen on a first-come first-served basis, and a limited number are seen during each clinic.   [facility]  [address] Ether Griffins [place], [place] [phone]   Eligibility Requirements You must have lived in [place], [place], or [place] counties for at least the last three months.   You cannot be eligible for state or federal sponsored [place], including [employer], [place], or [employer].    You generally cannot be eligible for healthcare insurance through your employer.    How to apply: Eligibility screenings are held every Tuesday and Wednesday afternoon from 1:00 pm until 4:00 pm. You do not need an appointment for the interview!  [facility] [address] [place], [place] [phone]   [facility] Health Department  [phone]   [facility] Health Department  [phone]   [facility] Health Department  [phone]    Behavioral Health Resources in the Community: Intensive Outpatient Programs Organization         Address  Phone  Notes  [facility] [address], [place], [place] [phone]   [facility] [address] [place], [place] [phone]   [facility] [address], [place], [place]  [phone]   [facility] [address]  [place], [place] [phone] or [phone]   Substance Abuse Resources Organization         Address  Phone  Notes  [facility]  [phone]   [facility]  [phone]   [facility] [place]  [phone]   Floydene Flock  [phone]   Residential & Outpatient Substance Abuse Program  [phone]   Psychological Services Organization         Address  Phone  Notes  [facility] Behavioral Health  [phone]   [facility]  [phone]   [facility] Mental Health [address] [place] [phone] or [phone]    Mobile Crisis Teams Organization         Address  Phone  Notes  [facility], Mobile Crisis Care Unit  [phone]   [facility]  [address] [place], [place] [phone]   Doristine Locks [address] [place] [phone]    Self-Help/Support Groups Organization         Address  Phone             Notes  [facility] [place] - variety of support groups  336- I7437963 Call  for more information  Narcotics Anonymous (NA), [facility] [address]  Dr, Rondall Allegra Childress  2 meetings at this location   Residential Treatment Programs Organization         Address  Phone  Notes  [facility] [address]    [place]  [phone]   [facility]  [address], [place] [address], [place], [place] [phone]   [facility] [address] [place], [place] [phone] Admissions: 8am-3pm M-F  [facility] [address]    [place], [place] [phone]   [facility] [address] [place], [place], [place] [phone]   [facility] [address],  [place], [place] [phone]   [facility] - Intensive Outpatient [address]ure[address], [place], [place] [phone]   [facility] ([facility] [address] [place]  [place], [place] [phone] or [phone]   [facility] ([facility]) [address], [place], [place] [phone] Accepts Medicaid  [facility] [address],  [place] [phone] Substance Abuse/Addiction Treatment   [facility] Organization         Address  Phone  Notes  [facility]  [phone]   Angie Fava, PhD [address] Ervin Knack [place], [place]   [phone] or [phone]   [facility]   [address] [place], [place] [phone]   [facility] [address] [place], [place] [phone] Insurance/Medicaid/sponsorship through [facility] [address]                                    [place], [place] [phone] Therapy/tele-psych/case  [facility] [address], [place] [phone]    Dr. Lolly Mustache  [phone]   [facility] [place]  [employer] [facility] Dept. 1) [address] [place] 2) [address], [place] 3)  [address], [place]  [phone]  [phone]   [facility] Child Abuse Hotline [phone] or [phone] (After Hours)

## 2014-04-16 LAB — URINE CULTURE

## 2014-04-24 ENCOUNTER — Ambulatory Visit: Payer: Managed Care, Other (non HMO) | Attending: Internal Medicine | Admitting: Internal Medicine

## 2014-04-24 ENCOUNTER — Encounter: Payer: Self-pay | Admitting: Internal Medicine

## 2014-04-24 VITALS — BP 138/90 | HR 88 | Temp 98.0°F | Resp 15 | Wt 340.0 lb

## 2014-04-24 DIAGNOSIS — E119 Type 2 diabetes mellitus without complications: Secondary | ICD-10-CM | POA: Diagnosis not present

## 2014-04-24 DIAGNOSIS — I10 Essential (primary) hypertension: Secondary | ICD-10-CM | POA: Insufficient documentation

## 2014-04-24 DIAGNOSIS — Z23 Encounter for immunization: Secondary | ICD-10-CM

## 2014-04-24 DIAGNOSIS — Z309 Encounter for contraceptive management, unspecified: Secondary | ICD-10-CM

## 2014-04-24 LAB — POCT URINALYSIS DIPSTICK
Bilirubin, UA: NEGATIVE
Glucose, UA: NEGATIVE
Ketones, UA: NEGATIVE
Nitrite, UA: NEGATIVE
Protein, UA: >=300
Spec Grav, UA: >=1.03
Urobilinogen, UA: 0.2
pH, UA: 5.5

## 2014-04-24 LAB — CBC WITH DIFFERENTIAL/PLATELET
Basophils Absolute: 0.1 10*3/uL (ref 0.0–0.1)
Basophils Relative: 1 % (ref 0–1)
EOS ABS: 0.2 10*3/uL (ref 0.0–0.7)
EOS PCT: 2 % (ref 0–5)
HCT: 48 % — ABNORMAL HIGH (ref 36.0–46.0)
HEMOGLOBIN: 16.1 g/dL — AB (ref 12.0–15.0)
LYMPHS PCT: 33 % (ref 12–46)
Lymphs Abs: 3.2 10*3/uL (ref 0.7–4.0)
MCH: 29.1 pg (ref 26.0–34.0)
MCHC: 33.5 g/dL (ref 30.0–36.0)
MCV: 86.6 fL (ref 78.0–100.0)
MPV: 10.3 fL (ref 8.6–12.4)
Monocytes Absolute: 0.6 10*3/uL (ref 0.1–1.0)
Monocytes Relative: 6 % (ref 3–12)
Neutro Abs: 5.6 10*3/uL (ref 1.7–7.7)
Neutrophils Relative %: 58 % (ref 43–77)
PLATELETS: 301 10*3/uL (ref 150–400)
RBC: 5.54 MIL/uL — AB (ref 3.87–5.11)
RDW: 13.4 % (ref 11.5–15.5)
WBC: 9.6 10*3/uL (ref 4.0–10.5)

## 2014-04-24 LAB — POCT URINE PREGNANCY: Preg Test, Ur: NEGATIVE

## 2014-04-24 LAB — POCT GLYCOSYLATED HEMOGLOBIN (HGB A1C): Hemoglobin A1C: 6.8

## 2014-04-24 MED ORDER — LISINOPRIL 10 MG PO TABS: 10.0000 mg | ORAL_TABLET | Freq: Every day | ORAL | Status: AC

## 2014-04-24 MED ORDER — METFORMIN HCL ER 500 MG PO TB24: 500.0000 mg | ORAL_TABLET | Freq: Every day | ORAL | Status: AC

## 2014-04-24 NOTE — Progress Notes (Signed)
Patient here to establish care Patient has a history of diabetes but has not been taking any medication In months Patient states her last period was in august due to Depo injection

## 2014-04-24 NOTE — Progress Notes (Signed)
Patient ID: Jessica Terrell, female   DOB: 09-Jun-1985, 29 y.o.   MRN: [id number]  [id number]  [id number]  DOB - [birth year]  CC:  Chief Complaint  Patient presents with  . Establish Care       HPI: Jessica Terrell is a 29 y.o. female here today to establish medical care. She has a past medical history of obesity, HTN, and T2DM.  She reports that she has not been on any diabetes medications in several months but was previously on Metformin and Lisinopril/Metoproplol. Denies tobacco, alcohol, and drug use.  She reports that she was Depo-provera and was due for her injections last month and never went. She states that she will continue to use condoms and not restart the injections today.   Patient has No headache, No chest pain, No abdominal pain - No Nausea, No new weakness tingling or numbness, No Cough - SOB.  No Known Allergies Past Medical History  Diagnosis Date  . Obesity   . Hypertension   . Diabetes mellitus without complication    Current Outpatient Prescriptions on File Prior to Visit  Medication Sig Dispense Refill  . cephALEXin (KEFLEX) 500 MG capsule Take 1 capsule (500 mg total) by mouth 4 (four) times daily. 40 capsule 0  . ondansetron (ZOFRAN ODT) 4 MG disintegrating tablet  ODT q4 hours prn nausea/vomit 4 tablet 0   No current facility-administered medications on file prior to visit.   History reviewed. No pertinent family history. History   Social History  . Marital Status: Single    Spouse Name: N/A  . Number of Children: N/A  . Years of Education: N/A   Occupational History  . Not on file.   Social History Main Topics  . Smoking status: Never Smoker   . Smokeless tobacco: Not on file  . Alcohol Use: No  . Drug Use: No  . Sexual Activity: Yes    Birth Control/ Protection: None   Other Topics Concern  . Not on file   Social History Narrative    Review of Systems: Constitutional: Negative for fever, chills, diaphoresis, activity change,  appetite change and fatigue. HENT: Negative for ear pain, nosebleeds, congestion, facial swelling, rhinorrhea, neck pain, neck stiffness and ear discharge.  Eyes: Negative for pain, discharge, redness, itching and visual disturbance. Respiratory: Negative for cough, choking, chest tightness, shortness of breath, wheezing and stridor.  Cardiovascular: Negative for chest pain, palpitations and leg swelling. Gastrointestinal: Negative for abdominal distention. Genitourinary: Negative for dysuria, urgency, frequency, hematuria, flank pain, decreased urine volume, difficulty urinating and dyspareunia.  Musculoskeletal: Negative for back pain, joint swelling, arthralgia and gait problem. Neurological: Negative for dizziness, tremors, seizures, syncope, facial asymmetry, speech difficulty, weakness, light-headedness, numbness and headaches.  Hematological: Negative for adenopathy. Does not bruise/bleed easily. Psychiatric/Behavioral: Negative for hallucinations, behavioral problems, confusion, dysphoric mood, decreased concentration and agitation.    Objective:   Filed Vitals:   04/24/14 0912  BP: 138/90  Pulse: 88  Temp: 98 F (36.7 C)  Resp: 15    Physical Exam: Constitutional: Patient appears well-developed and well-nourished. No distress. HENT: Normocephalic, atraumatic, External right and left ear normal. Oropharynx is clear and moist.  Eyes: Conjunctivae and EOM are normal. PERRLA, no scleral icterus. Neck: Normal ROM. Neck supple. No JVD. No tracheal deviation. No thyromegaly. CVS: RRR, S1/S2 +, no murmurs, no gallops, no carotid bruit.  Pulmonary: Effort and breath sounds normal, no stridor, rhonchi, wheezes, rales.  Abdominal: Soft. BS +, no distension, tenderness, rebound or guarding.  Musculoskeletal:  Normal range of motion. No edema and no tenderness.  Lymphadenopathy: No lymphadenopathy noted, cervical, inguinal or axillary Neuro: Alert. Normal reflexes, muscle tone  coordination. No cranial nerve deficit. Skin: Skin is warm and dry. No rash noted. Not diaphoretic. No erythema. No pallor. Psychiatric: Normal mood and affect. Behavior, judgment, thought content normal.  Lab Results  Component Value Date   WBC 11.0* 04/15/2014   HGB 16.0* 04/15/2014   HCT 46.1* 04/15/2014   MCV 84.3 04/15/2014   PLT 305 04/15/2014   Lab Results  Component Value Date   CREATININE 1.04 04/15/2014   BUN 11 04/15/2014   NA 133* 04/15/2014   K 3.8 04/15/2014   CL 107 04/15/2014   CO2 26 04/15/2014    Lab Results  Component Value Date   HGBA1C 6.80 04/24/2014   Lipid Panel  No results found for: CHOL, TRIG, HDL, CHOLHDL, VLDL, LDLCALC     Assessment and plan:   Jessica Terrell was seen today for establish care.  Diagnoses and all orders for this visit:  Type 2 diabetes mellitus without complication Orders: -     Glucose (CBG) -     HgB A1c -     Urinalysis Dipstick -     Amb Referral to Nutrition and Diabetic E -     Begin metFORMIN (GLUCOPHAGE XR) 500 MG 24 hr tablet; Take 1 tablet (500 mg total) by mouth daily with breakfast. -     Tdap vaccine greater than or equal to 7yo IM -     Microalbumin, urine -     Lipid panel -     Vitamin D, 25-hydroxy Although patient has been off her medication for several months she is actually doing well with a hemoglobin a1c of 6.8%.   Essential hypertension Orders: -     CBC with Differential -     COMPLETE METABOLIC PANEL WITH GFR -    Begin lisinopril (PRINIVIL,ZESTRIL) 10 MG tablet; Take 1 tablet (10 mg total) by mouth daily.  Encounter for contraceptive management, unspecified encounter Orders: -     POCT urine pregnancy---negative Stressed condom use for pregnancy and STD prevention  Need for prophylactic vaccination against Streptococcus pneumoniae (pneumococcus) Orders: -     Pneumococcal polysaccharide vaccine 23-valent greater than or equal to 2yo subcutaneous/IM    Return in about 3 months (around  07/25/2014) for DM/HTN.     Holland CommonsKECK, VALERIE, NP-C [facility] [phone] 04/24/2014, 9:20 AM

## 2014-04-24 NOTE — Patient Instructions (Signed)
Diabetes and Standards of Medical Care Diabetes is complicated. You may find that your diabetes team includes a dietitian, nurse, diabetes educator, eye doctor, and more. To help everyone know what is going on and to help you get the care you deserve, the following schedule of care was developed to help keep you on track. Below are the tests, exams, vaccines, medicines, education, and plans you will need. HbA1c test This test shows how well you have controlled your glucose over the past 2-3 months. It is used to see if your diabetes management plan needs to be adjusted.   It is performed at least 2 times a year if you are meeting treatment goals.  It is performed 4 times a year if therapy has changed or if you are not meeting treatment goals. Blood pressure test  This test is performed at every routine medical visit. The goal is less than 140/90 mm Hg for most people, but 130/80 mm Hg in some cases. Ask your health care provider about your goal. Dental exam  Follow up with the dentist regularly. Eye exam  If you are diagnosed with type 1 diabetes as a child, get an exam upon reaching the age of 27 years or older and have had diabetes for 3-5 years. Yearly eye exams are recommended after that initial eye exam.  If you are diagnosed with type 1 diabetes as an adult, get an exam within 5 years of diagnosis and then yearly.  If you are diagnosed with type 2 diabetes, get an exam as soon as possible after the diagnosis and then yearly. Foot care exam  Visual foot exams are performed at every routine medical visit. The exams check for cuts, injuries, or other problems with the feet.  A comprehensive foot exam should be done yearly. This includes visual inspection as well as assessing foot pulses and testing for loss of sensation.  Check your feet nightly for cuts, injuries, or other problems with your feet. Tell your health care provider if anything is not healing. Kidney function test (urine  microalbumin)  This test is performed once a year.  Type 1 diabetes: The first test is performed 5 years after diagnosis.  Type 2 diabetes: The first test is performed at the time of diagnosis.  A serum creatinine and estimated glomerular filtration rate (eGFR) test is done once a year to assess the level of chronic kidney disease (CKD), if present. Lipid profile (cholesterol, HDL, LDL, triglycerides)  Performed every 5 years for most people.  The goal for LDL is less than 100 mg/dL. If you are at high risk, the goal is less than 70 mg/dL.  The goal for HDL is 40 mg/dL-50 mg/dL for men and 50 mg/dL-60 mg/dL for women. An HDL cholesterol of 60 mg/dL or higher gives some protection against heart disease.  The goal for triglycerides is less than 150 mg/dL. Influenza vaccine, pneumococcal vaccine, and hepatitis B vaccine  The influenza vaccine is recommended yearly.  It is recommended that people with diabetes who are over 27 years old get the pneumonia vaccine. In some cases, two separate shots may be given. Ask your health care provider if your pneumonia vaccination is up to date.  The hepatitis B vaccine is also recommended for adults with diabetes. Diabetes self-management education  Education is recommended at diagnosis and ongoing as needed. Treatment plan  Your treatment plan is reviewed at every medical visit. Document Released: 12/04/2008 Document Revised: 06/23/2013 Document Reviewed: 07/09/2012 [facility] Patient Information 2015 [place]. This information is not intended to replace advice given to you by your health care provider. Make sure you discuss any questions you have with your health care provider.  

## 2014-04-25 LAB — COMPLETE METABOLIC PANEL WITH GFR
ALK PHOS: 72 U/L (ref 39–117)
ALT: 20 U/L (ref 0–35)
AST: 14 U/L (ref 0–37)
Albumin: 4 g/dL (ref 3.5–5.2)
BILIRUBIN TOTAL: 0.7 mg/dL (ref 0.2–1.2)
BUN: 7 mg/dL (ref 6–23)
CO2: 26 meq/L (ref 19–32)
Calcium: 10 mg/dL (ref 8.4–10.5)
Chloride: 105 mEq/L (ref 96–112)
Creat: 1.04 mg/dL (ref 0.50–1.10)
GFR, EST NON AFRICAN AMERICAN: 74 mL/min
GFR, Est African American: 86 mL/min
GLUCOSE: 153 mg/dL — AB (ref 70–99)
Potassium: 4.5 mEq/L (ref 3.5–5.3)
SODIUM: 143 meq/L (ref 135–145)
Total Protein: 7.2 g/dL (ref 6.0–8.3)

## 2014-04-25 LAB — LIPID PANEL
Cholesterol: 172 mg/dL (ref 0–200)
HDL: 40 mg/dL — ABNORMAL LOW (ref >=46)
LDL Cholesterol: 106 mg/dL — ABNORMAL HIGH (ref 0–99)
TRIGLYCERIDES: 129 mg/dL (ref <150)
Total CHOL/HDL Ratio: 4.3 Ratio
VLDL: 26 mg/dL (ref 0–40)

## 2014-04-25 LAB — MICROALBUMIN, URINE: Microalb, Ur: 82.3 mg/dL — ABNORMAL HIGH (ref <2.0)

## 2014-04-25 LAB — VITAMIN D 25 HYDROXY (VIT D DEFICIENCY, FRACTURES): Vit D, 25-Hydroxy: 10 ng/mL — ABNORMAL LOW (ref 30–100)

## 2014-04-28 ENCOUNTER — Telehealth: Payer: Self-pay | Admitting: *Deleted

## 2014-04-28 NOTE — Telephone Encounter (Signed)
Unable to leave a message on the phone provided.

## 2014-04-28 NOTE — Telephone Encounter (Signed)
-----   Message from Ambrose FinlandValerie A Keck, NP sent at 04/26/2014  5:38 PM EST ----- Vitamin D is low. Please send drisdol 50,000 IU to take once weekly for 12 weeks. 12 tablets no refills. Lots of protein in urine, likely due to not having diabetes/htn medication for so long. Will likely decrease since she is beginning back on medication.

## 2015-01-06 IMAGING — US US PELVIS COMPLETE
1 series · 13 of 25 positions shown · non-contrast
Comparison: CT of the abdomen and pelvis 09/30/2013.

CLINICAL DATA: Evaluate mass seen on recent CT examination in the
left adnexa.

EXAM:
TRANSABDOMINAL AND TRANSVAGINAL ULTRASOUND OF PELVIS
DOPPLER ULTRASOUND OF OVARIES
TECHNIQUE: Both transabdominal and transvaginal ultrasound examinations of the
pelvis were performed. Transabdominal technique was performed for
global imaging of the pelvis including uterus, ovaries, adnexal
regions, and pelvic cul-de-sac.
It was necessary to proceed with endovaginal exam following the
transabdominal exam to visualize the ovaries. Color and duplex
Doppler ultrasound was utilized to evaluate blood flow to the
ovaries.

[Series 1: us pelvis complete · 0.24mm/px · 58 acquisitions, 13 frames shown]
[im 1/58]
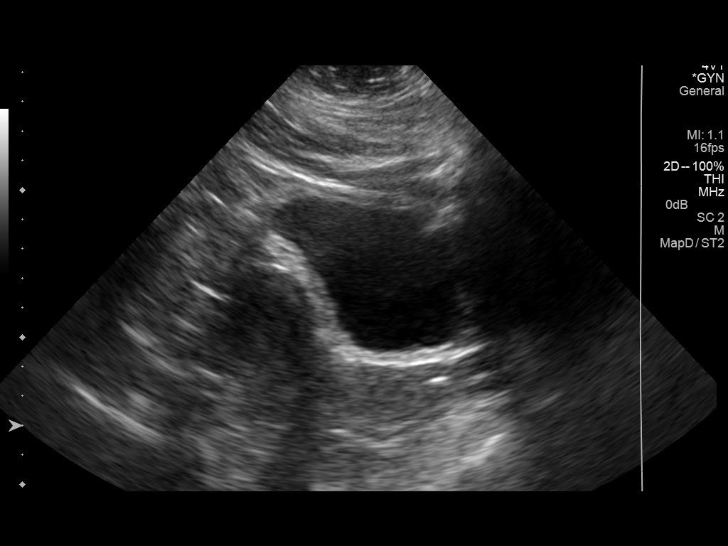
[im 5/58]
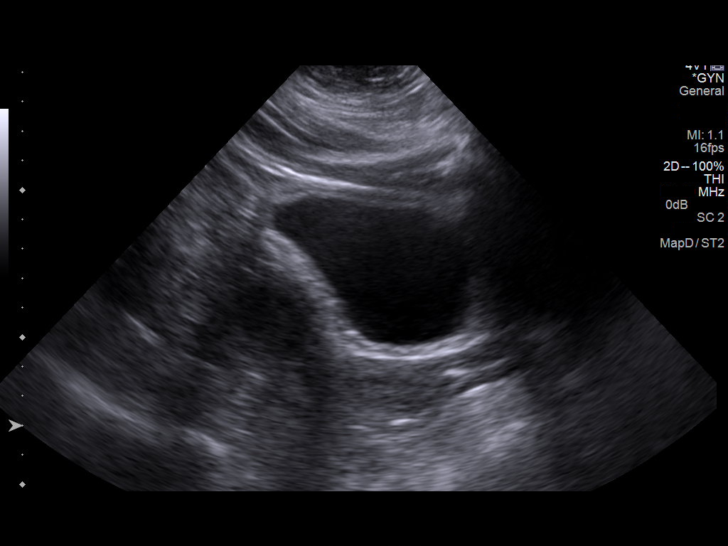
[im 10/58]
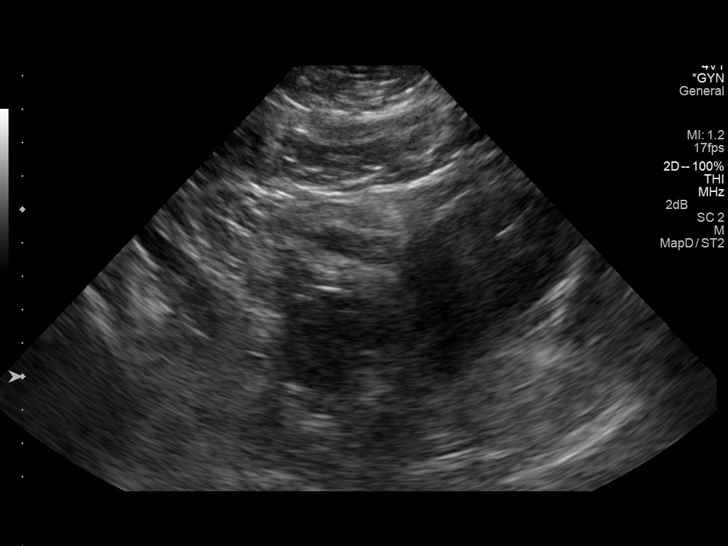
[im 15/58]
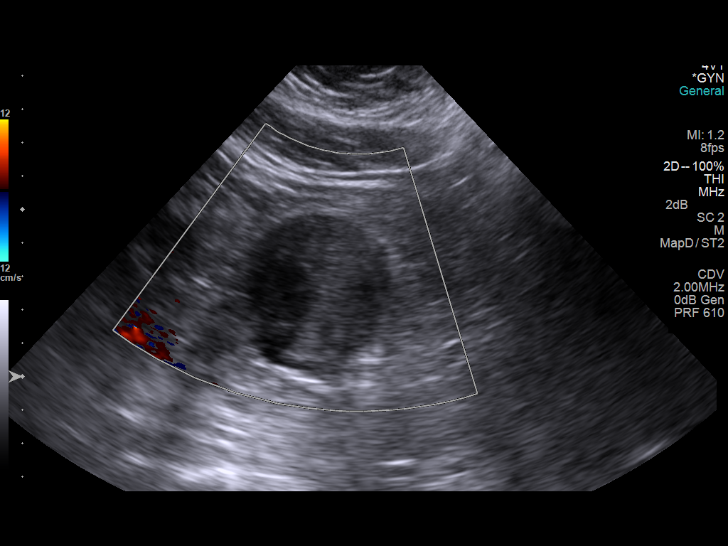
[im 20/58]
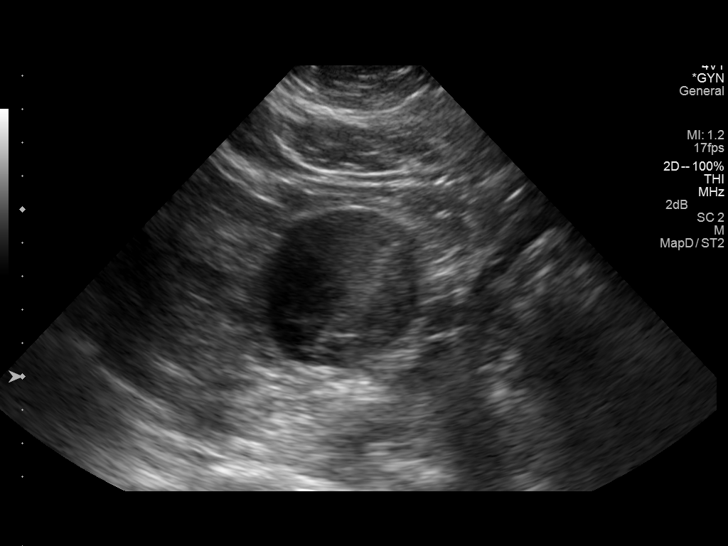
[im 24/58]
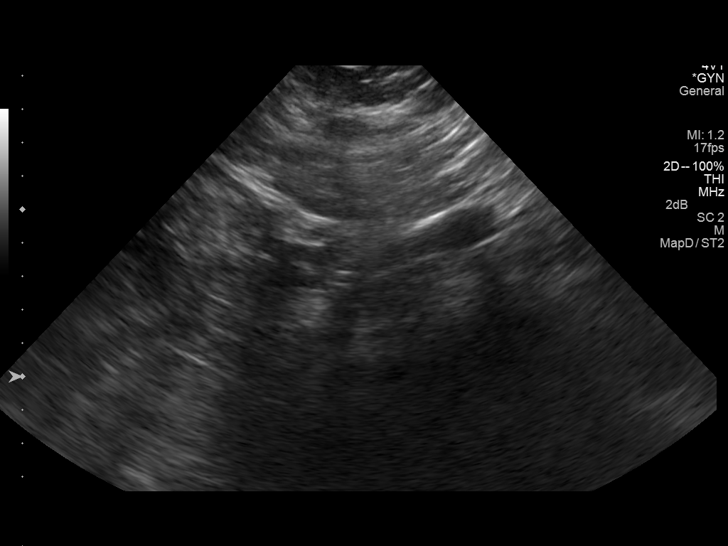
[im 29/58]
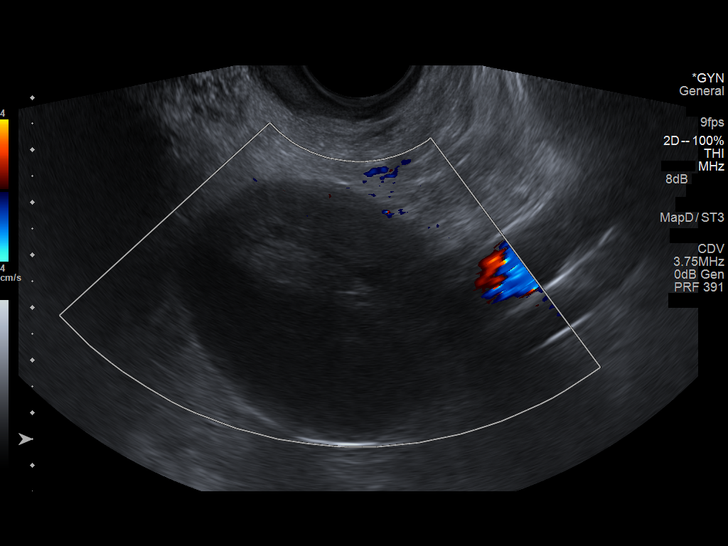
[im 34/58]
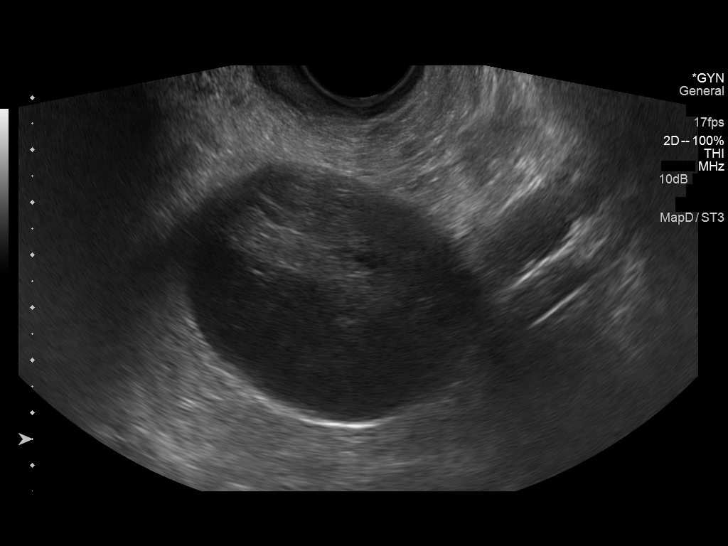
[im 39/58]
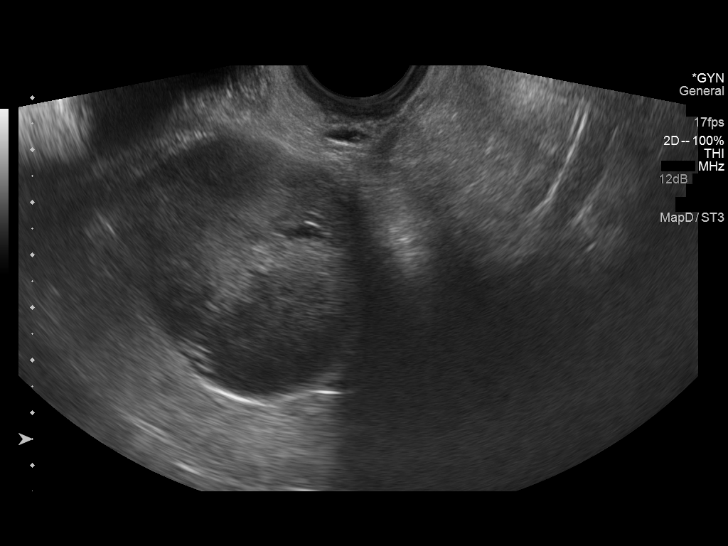
[im 43/58]
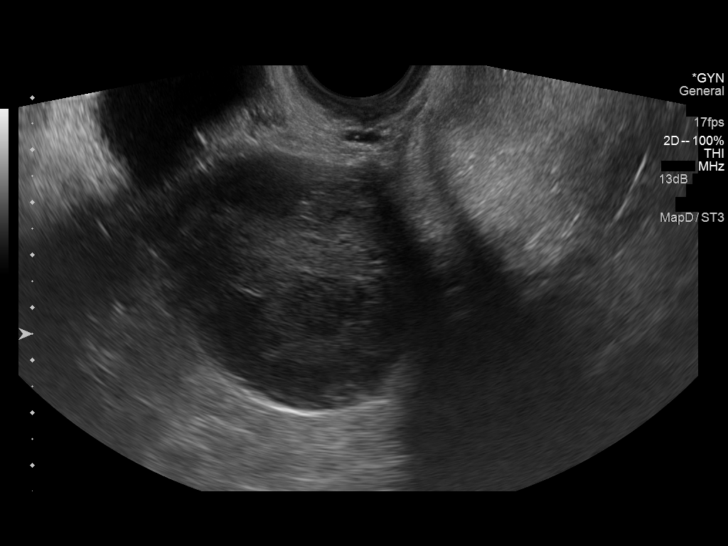
[im 48/58]
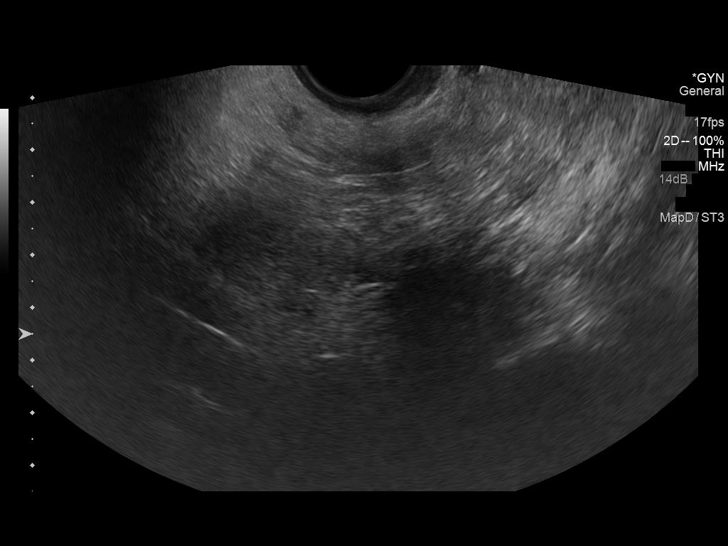
[im 53/58]
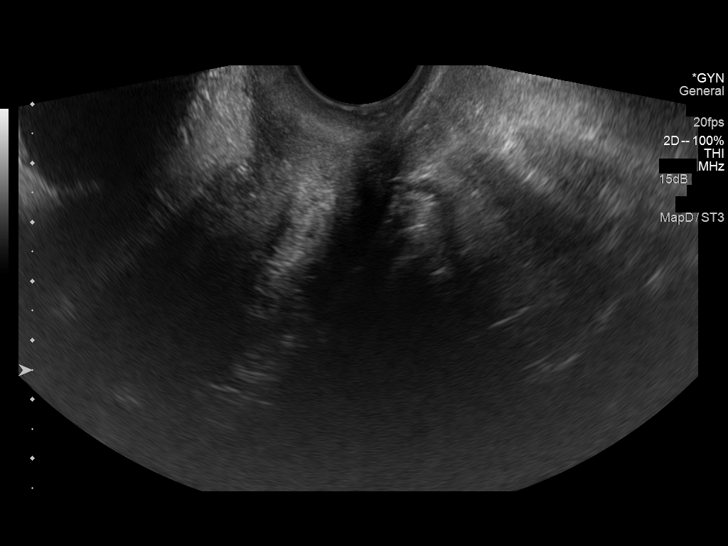
[im 58/58]
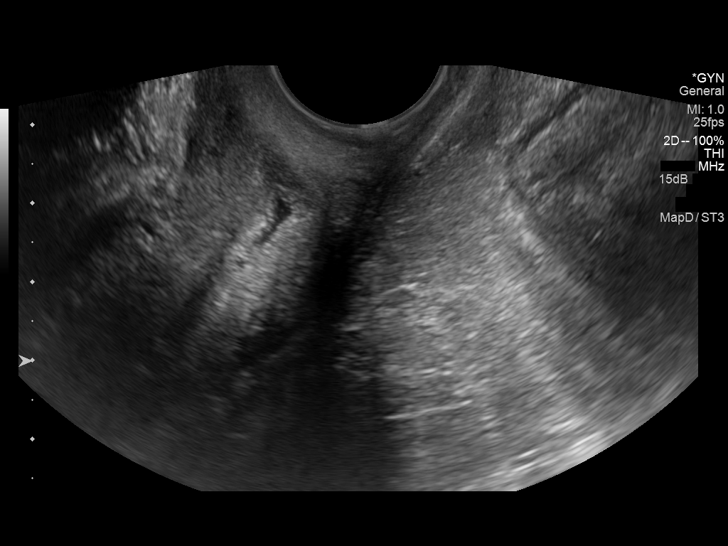

[13 of 25 positions shown; findings below may reference images not displayed]

FINDINGS: Uterus

Measurements: 7.5 x 3.2 x 3.2 cm. No fibroids or other mass
visualized.

Endometrium

Thickness: 4.3 mm thick.  No focal abnormality visualized.

Right ovary

Could not be visualized.

Left ovary

Measurements: 5.3 x 4.9 x 5.6 cm. Nearly the entire left ovary
appears replaced by a complex partially cystic and solid minus with
internal areas better anechoic and other areas that are isoechoic to
hyperechoic. This lesion does not demonstrate definitive internal
blood flow, this favored to represent a dermoid cyst.

Pulsed Doppler evaluation of the left ovary demonstrates normal
low-resistance arterial and venous waveforms.

Other findings

No free fluid.
IMPRESSION: 1. Left ovarian mass has imaging characteristics that are most
suggestive of a dermoid cyst. Nonemergent surgical consultation may
be warranted to consider removal, as this lesion although likely
benign may place the patient at risk for torsion. No signs of left
ovarian torsion at this time. Alternatively, a follow-up pelvic
ultrasound could be obtained in 6-12 weeks to ensure short-term
stability, with followed by yearly follow-up to demonstrate
continued stability.
2. Limited study which was not able to visualize a right ovary.
